# Patient Record
Sex: Female | Born: 1985 | Race: Black or African American | Hispanic: No | Smoking: Former smoker
Health system: Southern US, Community
[De-identification: ages and names within clinical notes are randomized; demographics above are authoritative.]

## PROBLEM LIST (undated history)

## (undated) DIAGNOSIS — J45909 Unspecified asthma, uncomplicated: Secondary | ICD-10-CM

## (undated) DIAGNOSIS — D649 Anemia, unspecified: Secondary | ICD-10-CM

## (undated) DIAGNOSIS — Z8742 Personal history of other diseases of the female genital tract: Secondary | ICD-10-CM

## (undated) DIAGNOSIS — Z8619 Personal history of other infectious and parasitic diseases: Secondary | ICD-10-CM

## (undated) HISTORY — PX: WISDOM TOOTH EXTRACTION: SHX21

## (undated) HISTORY — DX: Personal history of other diseases of the female genital tract: Z87.42

## (undated) HISTORY — DX: Personal history of other infectious and parasitic diseases: Z86.19

---
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
## 2008-11-09 ENCOUNTER — Emergency Department: Payer: Self-pay | Admitting: Emergency Medicine

## 2009-01-03 ENCOUNTER — Emergency Department: Payer: Self-pay | Admitting: Emergency Medicine

## 2009-02-04 ENCOUNTER — Emergency Department: Payer: Self-pay | Admitting: Emergency Medicine

## 2010-06-29 ENCOUNTER — Emergency Department (HOSPITAL_COMMUNITY)
Admission: EM | Admit: 2010-06-29 | Discharge: 2010-06-29 | Disposition: A | Discharge status: Home / Self Care | Payer: Self-pay | Attending: Emergency Medicine | Admitting: Emergency Medicine

## 2010-06-29 DIAGNOSIS — M79609 Pain in unspecified limb: Secondary | ICD-10-CM | POA: Insufficient documentation

## 2010-06-29 DIAGNOSIS — Y9229 Other specified public building as the place of occurrence of the external cause: Secondary | ICD-10-CM | POA: Insufficient documentation

## 2010-06-29 DIAGNOSIS — S0500XA Injury of conjunctiva and corneal abrasion without foreign body, unspecified eye, initial encounter: Secondary | ICD-10-CM | POA: Insufficient documentation

## 2010-06-29 DIAGNOSIS — H5710 Ocular pain, unspecified eye: Secondary | ICD-10-CM | POA: Insufficient documentation

## 2010-10-21 ENCOUNTER — Emergency Department: Payer: Self-pay | Admitting: Emergency Medicine

## 2011-04-08 ENCOUNTER — Emergency Department: Payer: Self-pay | Admitting: Unknown Physician Specialty

## 2012-08-09 ENCOUNTER — Emergency Department: Payer: Self-pay | Admitting: Emergency Medicine

## 2012-08-29 ENCOUNTER — Encounter: Payer: Self-pay | Admitting: Primary Care

## 2012-08-30 ENCOUNTER — Encounter: Payer: Self-pay | Admitting: Primary Care

## 2012-09-30 ENCOUNTER — Encounter: Payer: Self-pay | Admitting: Primary Care

## 2014-01-12 DIAGNOSIS — L209 Atopic dermatitis, unspecified: Secondary | ICD-10-CM | POA: Insufficient documentation

## 2014-01-18 ENCOUNTER — Emergency Department: Payer: Self-pay | Admitting: Emergency Medicine

## 2015-03-28 ENCOUNTER — Emergency Department
Admission: EM | Admit: 2015-03-28 | Discharge: 2015-03-28 | Disposition: A | Discharge status: Home / Self Care | Payer: Self-pay | Attending: Emergency Medicine | Admitting: Emergency Medicine

## 2015-03-28 ENCOUNTER — Encounter: Payer: Self-pay | Admitting: *Deleted

## 2015-03-28 DIAGNOSIS — Z8709 Personal history of other diseases of the respiratory system: Secondary | ICD-10-CM | POA: Insufficient documentation

## 2015-03-28 DIAGNOSIS — J111 Influenza due to unidentified influenza virus with other respiratory manifestations: Secondary | ICD-10-CM | POA: Insufficient documentation

## 2015-03-28 HISTORY — DX: Unspecified asthma, uncomplicated: J45.909

## 2015-03-28 MED ORDER — ALBUTEROL SULFATE HFA 108 (90 BASE) MCG/ACT IN AERS
1.0000 | INHALATION_SPRAY | Freq: Four times a day (QID) | RESPIRATORY_TRACT | Status: DC | PRN
Start: 2015-03-28 — End: 2019-03-08

## 2015-03-28 MED ORDER — HYDROCOD POLST-CPM POLST ER 10-8 MG/5ML PO SUER: 5.0000 mL | Freq: Two times a day (BID) | ORAL | Status: DC | PRN

## 2015-03-28 MED ORDER — OSELTAMIVIR PHOSPHATE 75 MG PO CAPS: 75.0000 mg | ORAL_CAPSULE | Freq: Two times a day (BID) | ORAL | Status: DC

## 2015-03-28 NOTE — Discharge Instructions (Signed)

## 2015-03-28 NOTE — ED Notes (Signed)
Pt states headache, fever, congestion, and body aches for 2 days, states productive cough

## 2015-03-28 NOTE — ED Notes (Signed)
Body aches with fever and cough for 2 days   States fever was 101 yesterday  Positive relief with OTC ibu

## 2015-03-28 NOTE — ED Provider Notes (Signed)
[facility] Emergency Department Provider Note  ____________________________________________  Time seen: Approximately 4:48 PM  I have reviewed the triage vital signs and the nursing notes.   HISTORY  Chief Complaint Fever; Chills; and Generalized Body Aches   HPI Wendy Robbins is a 30 y.o. female, NAD, presents to the emergency department with 2 day history sudden onset fevers, chills, body aches, cough. Fevers at home have ranged 99-102F. Has taken Motrin with some relief. Notes some nasal congestion, runny nose in which she has been taking DayQuil with mild relief. Notes her child has recently been diagnosed with hand-foot-and-mouth syndrome. She currently denies any sore throat or lesions in the mouth, hands, feet. No other known exposures.   Past Medical History  Diagnosis Date  . Asthma     There are no active problems to display for this patient.   History reviewed. No pertinent past surgical history.  Current Outpatient Rx  Name  Route  Sig  Dispense  Refill  . albuterol (PROVENTIL HFA;VENTOLIN HFA) 108 (90 Base) MCG/ACT inhaler   Inhalation   Inhale 1-2 puffs into the lungs every 6 (six) hours as needed for wheezing or shortness of breath.   1 Inhaler   2   . chlorpheniramine-HYDROcodone (TUSSIONEX PENNKINETIC ER) 10-8 MG/5ML SUER   Oral   Take 5 mLs by mouth every 12 (twelve) hours as needed for cough.   115 mL   0   . oseltamivir (TAMIFLU) 75 MG capsule   Oral   Take 1 capsule (75 mg total) by mouth 2 (two) times daily.   10 capsule   0     Allergies Shellfish allergy  History reviewed. No pertinent family history.  Social History Social History  Substance Use Topics  . Smoking status: Never Smoker   . Smokeless tobacco: None  . Alcohol Use: None     Review of Systems  Constitutional: Fevers, chills, fatigue. Eyes: No visual changes. No discharge, redness. ENT:  Nasal congestion, clear rhinorrhea. No sore  throat. No oral lesions. Cardiovascular: No chest pain Respiratory: Cough, wheezing. No shortness of breath. Gastrointestinal: No abdominal pain.  Nausea, no vomiting. Musculoskeletal: Generalized myalgias. Skin: Negative for rash nor lesions Neurological: Negative for headaches, focal weakness or numbness. 10-point ROS otherwise negative.  ____________________________________________   PHYSICAL EXAM:  VITAL SIGNS: ED Triage Vitals  Enc Vitals Group     BP 03/28/15 1640 124/76 mmHg     Pulse Rate 03/28/15 1640 108     Resp 03/28/15 1640 18     Temp 03/28/15 1640 99.7 F (37.6 C)     Temp Source 03/28/15 1640 Oral     SpO2 03/28/15 1640 99 %     Weight 03/28/15 1640 155 lb (70.308 kg)     Height 03/28/15 1640  (1.803 m)     Head Cir --      Peak Flow --      Pain Score 03/28/15 1641 10     Pain Loc --      Pain Edu? --      Excl. in GC? --     Constitutional: Alert and oriented. Well appearing and in no acute distress. Eyes: Conjunctivae are normal. PERRL. EOMI without pain. Head: Atraumatic. ENT:      Ears: TMs visualized bilaterally without perforation. Trace serous fluid with mild injection. Bilateral ear canals without abnormality      Nose: Clear rhinorrhea      Mouth/Throat: Mucous membranes are moist without  any oral lesions Neck: Supple with full range of motion. No stridor Hematological/Lymphatic/Immunilogical: No cervical lymphadenopathy. Cardiovascular: Normal rate, regular rhythm. Normal S1 and S2.  Good peripheral circulation. Respiratory: Normal respiratory effort without tachypnea or retractions. Lungs CTAB. Neurologic:  Normal speech and language. No gross focal neurologic deficits are appreciated.  Skin:  Skin is warm, dry and intact. No rash noted. Psychiatric: Mood and affect are normal. Speech and behavior are normal. Patient exhibits appropriate insight and judgement.   ____________________________________________   LABS (all labs ordered  are listed, but only abnormal results are displayed)  Labs Reviewed - No data to display ____________________________________________  EKG  None ____________________________________________  RADIOLOGY  None ____________________________________________    PROCEDURES  Procedure(s) performed: None   Medications - No data to display   ____________________________________________   INITIAL IMPRESSION / ASSESSMENT AND PLAN / ED COURSE  Pertinent labs & imaging results that were available during my care of the patient were reviewed by me and considered in my medical decision making (see chart for details).  Patient's diagnosis is consistent with influenza. Patient will be discharged home with prescriptions for Tamiflu, albuterol inhaler, dictation next cough syrup. [facility] this treatment course. Patient is given ED precautions to return to the ED for any worsening or new symptoms.   ____________________________________________  FINAL CLINICAL IMPRESSION(S) / ED DIAGNOSES  Final diagnoses:  Influenza  History of asthma      NEW MEDICATIONS STARTED DURING THIS VISIT:  New Prescriptions   ALBUTEROL (PROVENTIL HFA;VENTOLIN HFA) 108 (90 BASE) MCG/ACT INHALER    Inhale 1-2 puffs into the lungs every 6 (six) hours as needed for wheezing or shortness of breath.   CHLORPHENIRAMINE-HYDROCODONE (TUSSIONEX PENNKINETIC ER) 10-8 MG/5ML SUER    Take 5 mLs by mouth every 12 (twelve) hours as needed for cough.   OSELTAMIVIR (TAMIFLU) 75 MG CAPSULE    Take 1 capsule (75 mg total) by mouth 2 (two) times daily.        Hope Pigeon, PA-C 03/28/15 1714  Rockne Menghini, MD 04/03/15 2248

## 2015-07-19 DIAGNOSIS — Z8742 Personal history of other diseases of the female genital tract: Secondary | ICD-10-CM | POA: Insufficient documentation

## 2016-07-22 ENCOUNTER — Encounter: Payer: Self-pay | Admitting: Medical Oncology

## 2016-07-22 ENCOUNTER — Emergency Department
Admission: EM | Admit: 2016-07-22 | Discharge: 2016-07-22 | Disposition: A | Discharge status: Home / Self Care | Payer: Self-pay | Attending: Emergency Medicine | Admitting: Emergency Medicine

## 2016-07-22 DIAGNOSIS — Y929 Unspecified place or not applicable: Secondary | ICD-10-CM | POA: Insufficient documentation

## 2016-07-22 DIAGNOSIS — Y9389 Activity, other specified: Secondary | ICD-10-CM | POA: Insufficient documentation

## 2016-07-22 DIAGNOSIS — Y990 Civilian activity done for income or pay: Secondary | ICD-10-CM | POA: Insufficient documentation

## 2016-07-22 DIAGNOSIS — J45909 Unspecified asthma, uncomplicated: Secondary | ICD-10-CM | POA: Insufficient documentation

## 2016-07-22 DIAGNOSIS — S39012A Strain of muscle, fascia and tendon of lower back, initial encounter: Secondary | ICD-10-CM | POA: Insufficient documentation

## 2016-07-22 DIAGNOSIS — X500XXA Overexertion from strenuous movement or load, initial encounter: Secondary | ICD-10-CM | POA: Insufficient documentation

## 2016-07-22 DIAGNOSIS — Z79899 Other long term (current) drug therapy: Secondary | ICD-10-CM | POA: Insufficient documentation

## 2016-07-22 MED ORDER — KETOROLAC TROMETHAMINE 30 MG/ML IJ SOLN
30.0000 mg | Freq: Once | INTRAMUSCULAR | Status: AC
  Administered 2016-07-22: 30 mg via INTRAMUSCULAR
  Filled 2016-07-22: qty 1

## 2016-07-22 MED ORDER — MELOXICAM 15 MG PO TABS
15.0000 mg | ORAL_TABLET | Freq: Every day | ORAL | 0 refills | Status: AC
Start: 2016-07-22 — End: 2017-07-22

## 2016-07-22 NOTE — ED Notes (Signed)
Lower back pain, worse on right. 3/10. No numbness BLE. Denies burning or pain with urination.

## 2016-07-22 NOTE — ED Triage Notes (Signed)
Pt reports lots of heavy lifting at work, since Monday has been having lower back pain.

## 2016-07-22 NOTE — ED Provider Notes (Signed)
[facility] Emergency Department Provider Note ____________________________________________  Time seen: 9:18 AM  I have reviewed the triage vital signs and the nursing notes.  HISTORY  Chief Complaint  Back Pain   HPI Wendy Robbins is a 31 y.o. female is here with complaint of lower back pain after lifting at work for the last 2 days. Patient states that she took ibuprofen on Monday has not taken any since. She denies any urinary symptoms. There is been no incontinence of bowel or bladder. She states that today she is more stiff than yesterday. She continues to ambulate without assistance. Currently she is on her menses and denies any chance of pregnancy. She rates her pain as an 8 out of 10.  Past Medical History:  Diagnosis Date  . Asthma     There are no active problems to display for this patient.   No past surgical history on file.  Prior to Admission medications   Medication Sig Start Date End Date Taking? Authorizing Provider  albuterol (PROVENTIL HFA;VENTOLIN HFA) 108 (90 Base) MCG/ACT inhaler Inhale 1-2 puffs into the lungs every 6 (six) hours as needed for wheezing or shortness of breath. 03/28/15   Hagler, Jami L, PA-C  chlorpheniramine-HYDROcodone (TUSSIONEX PENNKINETIC ER) 10-8 MG/5ML SUER Take 5 mLs by mouth every 12 (twelve) hours as needed for cough. 03/28/15   Hagler, Jami L, PA-C  meloxicam (MOBIC) 15 MG tablet Take 1 tablet (15 mg total) by mouth daily. 07/22/16 07/22/17  Tommi Rumps, PA-C  oseltamivir (TAMIFLU) 75 MG capsule Take 1 capsule (75 mg total) by mouth 2 (two) times daily. 03/28/15   Hagler, Jami L, PA-C    Allergies Shellfish allergy  No family history on file.  Social History Social History  Substance Use Topics  . Smoking status: Never Smoker  . Smokeless tobacco: Not on file  . Alcohol use Not on file    Review of Systems  Constitutional: Negative for fever. Cardiovascular: Negative for chest  pain. Respiratory: Negative for shortness of breath. Gastrointestinal: Negative for abdominal pain Genitourinary: Negative for dysuria. Musculoskeletal: Positive for low back pain. Skin: Negative for rash. Neurological: Negative for headaches, focal weakness or numbness. ____________________________________________  PHYSICAL EXAM:  VITAL SIGNS: ED Triage Vitals  Enc Vitals Group     BP 07/22/16 0906 129/71     Pulse Rate 07/22/16 0906 77     Resp 07/22/16 0906 16     Temp 07/22/16 0906 98 F (36.7 C)     Temp Source 07/22/16 0906 Oral     SpO2 07/22/16 0906 100 %     Weight 07/22/16 0905 140 lb (63.5 kg)     Height 07/22/16 0905 5\' 10"  (1.778 m)     Head Circumference --      Peak Flow --      Pain Score 07/22/16 0903 8     Pain Loc --      Pain Edu? --      Excl. in GC? --     Constitutional: Alert and oriented. Well appearing and in no distress. Head: Normocephalic and atraumatic. Eyes: Conjunctivae are normal.  Cardiovascular: Normal rate, regular rhythm. Normal distal pulses. Respiratory: Normal respiratory effort. No wheezes/rales/rhonchi. Musculoskeletal: Examination of lower back there is no gross deformity. There is no midline tenderness on palpation of the lumbar spine. Paravertebral muscles are tender with the right being greater than the left. Range of motion is slow without active muscle spasm seen. Patient is ambulatory without assistance.  Neurologic:  Normal gait without ataxia. Normal speech and language. No gross focal neurologic deficits are appreciated. Skin:  Skin is warm, dry and intact. No rash noted. Psychiatric: Mood and affect are normal. Patient exhibits appropriate insight and judgment. ____________________________________________  INITIAL IMPRESSION / ASSESSMENT AND PLAN / ED COURSE  Patient was given Toradol 30 mg IM in the department and did get some relief from her back pain. She is discharged with a prescription for meloxicam 15 mg one daily  with food. [facility] if any continued problems.    ____________________________________________  FINAL CLINICAL IMPRESSION(S) / ED DIAGNOSES  Final diagnoses:  Strain of lumbar region, initial encounter     Eather ColasSummers, Rhonda L, PA-C 07/22/16 1052    Sharman CheekStafford, Phillip, MD 07/23/16 [phone]

## 2016-07-22 NOTE — Discharge Instructions (Signed)
[facility]. Begin taking meloxicam 15 mg one daily with food.

## 2018-03-05 HISTORY — PX: OTHER SURGICAL HISTORY: SHX169

## 2018-03-22 LAB — HM PAP SMEAR: HM Pap smear: NEGATIVE

## 2018-03-22 LAB — HM HIV SCREENING LAB: HM HIV Screening: NEGATIVE

## 2018-08-16 LAB — OB RESULTS CONSOLE HIV ANTIBODY (ROUTINE TESTING): HIV: NONREACTIVE

## 2018-08-16 LAB — OB RESULTS CONSOLE HEPATITIS B SURFACE ANTIGEN: Hepatitis B Surface Ag: NEGATIVE

## 2018-08-16 LAB — OB RESULTS CONSOLE VARICELLA ZOSTER ANTIBODY, IGG: Varicella: IMMUNE

## 2018-08-16 LAB — OB RESULTS CONSOLE RPR: RPR: NONREACTIVE

## 2018-08-16 LAB — OB RESULTS CONSOLE RUBELLA ANTIBODY, IGM: Rubella: IMMUNE

## 2018-08-23 ENCOUNTER — Encounter: Payer: Self-pay | Admitting: Advanced Practice Midwife

## 2019-02-22 ENCOUNTER — Observation Stay
Admission: EM | Admit: 2019-02-22 | Discharge: 2019-02-22 | Disposition: A | Discharge status: Home / Self Care | Payer: Medicaid Other | Attending: Obstetrics and Gynecology | Admitting: Obstetrics and Gynecology

## 2019-02-22 ENCOUNTER — Encounter: Payer: Self-pay | Admitting: *Deleted

## 2019-02-22 ENCOUNTER — Other Ambulatory Visit: Payer: Self-pay

## 2019-02-22 DIAGNOSIS — O99513 Diseases of the respiratory system complicating pregnancy, third trimester: Secondary | ICD-10-CM | POA: Insufficient documentation

## 2019-02-22 DIAGNOSIS — Z3A36 36 weeks gestation of pregnancy: Secondary | ICD-10-CM | POA: Diagnosis not present

## 2019-02-22 DIAGNOSIS — O133 Gestational [pregnancy-induced] hypertension without significant proteinuria, third trimester: Secondary | ICD-10-CM | POA: Diagnosis present

## 2019-02-22 DIAGNOSIS — J45909 Unspecified asthma, uncomplicated: Secondary | ICD-10-CM | POA: Insufficient documentation

## 2019-02-22 DIAGNOSIS — Z91013 Allergy to seafood: Secondary | ICD-10-CM | POA: Insufficient documentation

## 2019-02-22 DIAGNOSIS — Z79899 Other long term (current) drug therapy: Secondary | ICD-10-CM | POA: Insufficient documentation

## 2019-02-22 DIAGNOSIS — O163 Unspecified maternal hypertension, third trimester: Secondary | ICD-10-CM | POA: Diagnosis not present

## 2019-02-22 HISTORY — DX: Anemia, unspecified: D64.9

## 2019-02-22 LAB — CBC
HCT: 33.4 % — ABNORMAL LOW (ref 36.0–46.0)
Hemoglobin: 11.2 g/dL — ABNORMAL LOW (ref 12.0–15.0)
MCH: 31.1 pg (ref 26.0–34.0)
MCHC: 33.5 g/dL (ref 30.0–36.0)
MCV: 92.8 fL (ref 80.0–100.0)
Platelets: 203 10*3/uL (ref 150–400)
RBC: 3.6 MIL/uL — ABNORMAL LOW (ref 3.87–5.11)
RDW: 13.6 % (ref 11.5–15.5)
WBC: 10 10*3/uL (ref 4.0–10.5)
nRBC: 0 % (ref 0.0–0.2)

## 2019-02-22 LAB — COMPREHENSIVE METABOLIC PANEL
ALT: 12 U/L (ref 0–44)
AST: 19 U/L (ref 15–41)
Albumin: 3.1 g/dL — ABNORMAL LOW (ref 3.5–5.0)
Alkaline Phosphatase: 137 U/L — ABNORMAL HIGH (ref 38–126)
Anion gap: 9 (ref 5–15)
BUN: 10 mg/dL (ref 6–20)
CO2: 18 mmol/L — ABNORMAL LOW (ref 22–32)
Calcium: 8.8 mg/dL — ABNORMAL LOW (ref 8.9–10.3)
Chloride: 108 mmol/L (ref 98–111)
Creatinine, Ser: 0.8 mg/dL (ref 0.44–1.00)
GFR calc Af Amer: >60 mL/min (ref >60)
GFR calc non Af Amer: >60 mL/min (ref >60)
Glucose, Bld: 77 mg/dL (ref 70–99)
Potassium: 3.8 mmol/L (ref 3.5–5.1)
Sodium: 135 mmol/L (ref 135–145)
Total Bilirubin: 0.6 mg/dL (ref 0.3–1.2)
Total Protein: 6.4 g/dL — ABNORMAL LOW (ref 6.5–8.1)

## 2019-02-22 LAB — PROTEIN / CREATININE RATIO, URINE
Creatinine, Urine: 72 mg/dL
Protein Creatinine Ratio: 0.19 mg/mg{Cre} — ABNORMAL HIGH (ref 0.00–0.15)
Total Protein, Urine: 14 mg/dL

## 2019-02-22 LAB — OB RESULTS CONSOLE GC/CHLAMYDIA
Chlamydia: NEGATIVE
Gonorrhea: NEGATIVE

## 2019-02-22 LAB — OB RESULTS CONSOLE GBS: GBS: NEGATIVE

## 2019-02-22 NOTE — OB Triage Note (Signed)
Sent over from the office for [facility] evaluation. Wendy Robbins

## 2019-02-22 NOTE — Discharge Instructions (Signed)
Preeclampsia and Eclampsia Preeclampsia is a serious condition that may develop during pregnancy. This condition causes high blood pressure and increased protein in your urine along with other symptoms, such as headaches and vision changes. These symptoms may develop as the condition gets worse. Preeclampsia may occur at 20 weeks of pregnancy or later. Diagnosing and treating preeclampsia early is very important. If not treated early, it can cause serious problems for you and your baby. One problem it can lead to is eclampsia. Eclampsia is a condition that causes muscle jerking or shaking (convulsions or seizures) and other serious problems for the mother. During pregnancy, delivering your baby may be the best treatment for preeclampsia or eclampsia. For most women, preeclampsia and eclampsia symptoms go away after giving birth. In rare cases, a woman may develop preeclampsia after giving birth (postpartum preeclampsia). This usually occurs within 48 hours after childbirth but may occur up to 6 weeks after giving birth. What are the causes? The cause of preeclampsia is not known. What increases the risk? The following risk factors make you more likely to develop preeclampsia:  Being pregnant for the first time.  Having had preeclampsia during a past pregnancy.  Having a family history of preeclampsia.  Having high blood pressure.  Being pregnant with more than one baby.  Being 33 or older.  Being African-American.  Having kidney disease or diabetes.  Having medical conditions such as lupus or blood diseases.  Being very overweight (obese). What are the signs or symptoms? The most common symptoms are:  Severe headaches.  Vision problems, such as blurred or double vision.  Abdominal pain, especially upper abdominal pain. Other symptoms that may develop as the condition gets worse include:  Sudden weight gain.  Sudden swelling of the hands, face, legs, and feet.  Severe  nausea and vomiting.  Numbness in the face, arms, legs, and feet.  Dizziness.  Urinating less than usual.  Slurred speech.  Convulsions or seizures. How is this diagnosed? There are no screening tests for preeclampsia. Your health care provider will ask you about symptoms and check for signs of preeclampsia during your prenatal visits. You may also have tests that include:  Checking your blood pressure.  Urine tests to check for protein. Your health care provider will check for this at every prenatal visit.  Blood tests.  Monitoring your baby's heart rate.  Ultrasound. How is this treated? You and your health care provider will determine the treatment approach that is best for you. Treatment may include:  Having more frequent prenatal exams to check for signs of preeclampsia, if you have an increased risk for preeclampsia.  Medicine to lower your blood pressure.  Staying in the hospital, if your condition is severe. There, treatment will focus on controlling your blood pressure and the amount of fluids in your body (fluid retention).  Taking medicine (magnesium sulfate) to prevent seizures. This may be given as an injection or through an IV.  Taking a low-dose aspirin during your pregnancy.  Delivering your baby early. You may have your labor started with medicine (induced), or you may have a cesarean delivery. Follow these instructions at home: Eating and drinking   Drink enough fluid to keep your urine pale yellow.  Avoid caffeine. Lifestyle  Do not use any products that contain nicotine or tobacco, such as cigarettes and e-cigarettes. If you need help quitting, ask your health care provider.  Do not use alcohol or drugs.  Avoid stress as much as possible. Rest and get  plenty of sleep. General instructions  Take over-the-counter and prescription medicines only as told by your health care provider.  When lying down, lie on your left side. This keeps pressure  off your major blood vessels.  When sitting or lying down, raise (elevate) your feet. Try putting some pillows underneath your lower legs.  Exercise regularly. Ask your health care provider what kinds of exercise are best for you.  Keep all follow-up and prenatal visits as told by your health care provider. This is important. How is this prevented? There is no known way of preventing preeclampsia or eclampsia from developing. However, to lower your risk of complications and detect problems early:  Get regular prenatal care. Your health care provider may be able to diagnose and treat the condition early.  Maintain a healthy weight. Ask your health care provider for help managing weight gain during pregnancy.  Work with your health care provider to manage any long-term (chronic) health conditions you have, such as diabetes or kidney problems.  You may have tests of your blood pressure and kidney function after giving birth.  Your health care provider may have you take low-dose aspirin during your next pregnancy. Contact a health care provider if:  You have symptoms that your health care provider told you may require more treatment or monitoring, such as: ? Headaches. ? Nausea or vomiting. ? Abdominal pain. ? Dizziness. ? Light-headedness. Get help right away if:  You have severe: ? Abdominal pain. ? Headaches that do not get better. ? Dizziness. ? Vision problems. ? Confusion. ? Nausea or vomiting.  You have any of the following: ? A seizure. ? Sudden, rapid weight gain. ? Sudden swelling in your hands, ankles, or face. ? Trouble moving any part of your body. ? Numbness in any part of your body. ? Trouble speaking. ? Abnormal bleeding.  You faint. Summary  Preeclampsia is a serious condition that may develop during pregnancy.  This condition causes high blood pressure and increased protein in your urine along with other symptoms, such as headaches and vision  changes.  Diagnosing and treating preeclampsia early is very important. If not treated early, it can cause serious problems for you and your baby.  Get help right away if you have symptoms that your health care provider told you to watch for. This information is not intended to replace advice given to you by your health care provider. Make sure you discuss any questions you have with your health care provider. Document Released: 02/14/2000 Document Revised: 10/19/2017 Document Reviewed: 09/23/2015 Elsevier Patient Education  2020 [place] your blood pressure at home twice a day. Please write down the readings so we can discuss it at your appointment on Monday.  Call provider right away for blood pressure systolic 836 or greater OR diastolic 629 or greater.  Call provider right away for any of the warning signs listed above.

## 2019-02-22 NOTE — Discharge Summary (Signed)
Wendy Robbins is a 33 y.o. female. She is at [redacted]w[redacted]d gestation. No LMP recorded. Patient is pregnant. Estimated Date of Delivery: 03/16/19  Prenatal care site: [facility]  Chief complaint: Sent from office d/t elevated blood pressure fore preeclampsia eval  Patient was evaluated in the office today for a routine OB appointment.  Her initial b/p was 154/76, recheck was 150/98.  She denies HA, changes in vision, RUQ.  Reports mild dependent edema that goes away after resting.   S: Resting comfortably.   She reports:  -active fetal movement -no leakage of fluid -no vaginal bleeding -no contractions  Maternal Medical History:   Past Medical History:  Diagnosis Date  . Anemia   . Asthma     Past Surgical History:  Procedure Laterality Date  . WISDOM TOOTH EXTRACTION      Allergies  Allergen Reactions  . Shellfish-Derived Products Anaphylaxis  . Shellfish Allergy     Prior to Admission medications   Medication Sig Start Date End Date Taking? Authorizing Provider  ferrous sulfate 325 (65 FE) MG EC tablet Take 325 mg by mouth 3 (three) times daily with meals.   Yes [provider]  Prenatal Vit-Fe Fumarate-FA (MULTIVITAMIN-PRENATAL) 27-0.8 MG TABS tablet Take 1 tablet by mouth daily at 12 noon.   Yes [provider]  albuterol (PROVENTIL HFA;VENTOLIN HFA) 108 (90 Base) MCG/ACT inhaler Inhale 1-2 puffs into the lungs every 6 (six) hours as needed for wheezing or shortness of breath. Patient not taking: Reported on 02/22/2019 03/28/15   Hagler, Jami L, PA-C  chlorpheniramine-HYDROcodone (TUSSIONEX PENNKINETIC ER) 10-8 MG/5ML SUER Take 5 mLs by mouth every 12 (twelve) hours as needed for cough. Patient not taking: Reported on 02/22/2019 03/28/15   Hagler, Jami L, PA-C  oseltamivir (TAMIFLU) 75 MG capsule Take 1 capsule (75 mg total) by mouth 2 (two) times daily. Patient not taking: Reported on 02/22/2019 03/28/15   Hagler, Ernestene Kiel, PA-C     Social  History: She  reports that she has never smoked. She does not have any smokeless tobacco history on file. She reports previous alcohol use. She reports previous drug use.  Family History: family history is not on file.  no history of gyn cancers  Review of Systems: A full review of systems was performed and negative except as noted in the HPI.    O:  BP 134/83   Pulse 80   Temp 98.1 F (36.7 C) (Oral)   Resp 18   Ht 5\' 11"  (1.803 m)   Wt 81.6 kg   BMI 25.10 kg/m  Results for orders placed or performed during the hospital encounter of 02/22/19 (from the past 48 hour(s))  Comprehensive metabolic panel   Collection Time: 02/22/19 11:01 AM  Result Value Ref Range   Sodium 135 135 - 145 mmol/L   Potassium 3.8 3.5 - 5.1 mmol/L   Chloride 108 98 - 111 mmol/L   CO2 18 (L) 22 - 32 mmol/L   Glucose, Bld 77 70 - 99 mg/dL   BUN 10 6 - 20 mg/dL   Creatinine, Ser 02/24/19 0.44 - 1.00 mg/dL   Calcium 8.8 (L) 8.9 - 10.3 mg/dL   Total Protein 6.4 (L) 6.5 - 8.1 g/dL   Albumin 3.1 (L) 3.5 - 5.0 g/dL   AST 19 15 - 41 U/L   ALT 12 0 - 44 U/L   Alkaline Phosphatase 137 (H) 38 - 126 U/L   Total Bilirubin 0.6 0.3 - 1.2 mg/dL  GFR calc non Af Amer >60 >60 mL/min   GFR calc Af Amer >60 >60 mL/min   Anion gap 9 5 - 15  CBC on admission   Collection Time: 02/22/19 11:01 AM  Result Value Ref Range   WBC 10.0 4.0 - 10.5 K/uL   RBC 3.60 (L) 3.87 - 5.11 MIL/uL   Hemoglobin 11.2 (L) 12.0 - 15.0 g/dL   HCT 33.4 (L) 36.0 - 46.0 %   MCV 92.8 80.0 - 100.0 fL   MCH 31.1 26.0 - 34.0 pg   MCHC 33.5 30.0 - 36.0 g/dL   RDW 13.6 11.5 - 15.5 %   Platelets 203 150 - 400 K/uL   nRBC 0.0 0.0 - 0.2 %  Protein / creatinine ratio, urine   Collection Time: 02/22/19 11:05 AM  Result Value Ref Range   Creatinine, Urine 72 mg/dL   Total Protein, Urine 14 mg/dL   Protein Creatinine Ratio 0.19 (H) 0.00 - 0.15 mg/mg[Cre]     Constitutional: NAD, AAOx3  HE/ENT: extraocular movements grossly intact, moist mucous  membranes CV: RRR PULM: normal respiratory effort, CTABL     Abd: gravid, non-tender, non-distended, soft      Ext: Non-tender, mild, dependent edema in LE   Psych: mood appropriate, speech normal Pelvic deferred  NST:  Baseline: 135 Variability: moderate Accelerations:15x15 present  Decelerations: absent Toco: Irregular, palpate mild Category 1 Reactive NST   A/P: 33 y.o. [redacted]w[redacted]d here for antenatal surveillance during pregnancy.  Principle diagnosis:   Elevated blood pressure affecting pregnancy  Blood pressure ranges from 130-139/83-90.    Labs WNL  No incidence of elevated blood pressure 4 hours apart   Call for headache, changes in vision, or RUQ.   Labor  Not present  Fetal Wellbeing  Reactive NST, reassuring for GA  D/c home stable, precautions reviewed, follow-up as scheduled on Monday.  Patient to check blood pressure at home twice a day  Reviewed blood pressure parameters  Call provider right away for 160/110 or greater    Minda Meo, CNM 02/22/2019 12:40 PM [facility], Department of [facility]

## 2019-03-01 ENCOUNTER — Encounter
Admission: RE | Admit: 2019-03-01 | Discharge: 2019-03-01 | Disposition: A | Discharge status: Home / Self Care | Payer: Medicaid Other | Source: Ambulatory Visit | Attending: Obstetrics and Gynecology | Admitting: Obstetrics and Gynecology

## 2019-03-01 ENCOUNTER — Other Ambulatory Visit: Payer: Self-pay

## 2019-03-01 NOTE — Patient Instructions (Signed)
Your COVID test is scheduled any time between 8:00 am and noon on Thursday 03/02/2019.  Drive up in front of the [facility] and remain in your vehicle.  Your procedure is scheduled on: Monday 03/06/2019.  Check in on the 1st floor [facility] Main Entrance) at 9:30 am   Remember: Instructions that are not followed completely may result in serious medical risk, up to and including death, or upon the discretion of your surgeon and anesthesiologist your surgery may need to be rescheduled.    __x__ 1. Do not eat food (including mints, candies, chewing gum) after midnight the night before your procedure. You may drink clear liquids up to 2 hours before you are scheduled to arrive at the hospital for your procedure.  Do not drink anything within 2 hours of your scheduled arrival to the hospital.  Approved clear liquids:  --Water or Apple juice without pulp  --Clear carbohydrate beverage such as Gatorade or Powerade  --Black Coffee or Clear Tea (No milk, no creamers, do not add anything to the coffee or tea)    __x__ 2. No Alcohol or smoking for 24 hours before or after surgery.   __x__ 3. Notify your doctor if there is any change in your medical condition (cold, fever, infections).   __x__ 4. On the morning of surgery brush your teeth with toothpaste and water.  You may rinse your mouth with mouthwash if you wish.  Do not swallow any toothpaste or mouthwash.  Please read over the following fact sheets that you were given:   [facility] Preparing for Surgery and/or MRSA Information    __x__ Use CHG Soap as directed on instruction sheet.   Do not wear jewelry, make-up, hairpins, clips or nail polish on the day of surgery.  Do not wear lotions, powders, deodorant, or perfumes.   Do not shave below the face/neck 48 hours prior to surgery.   Do not bring valuables to the hospital.    [facility] is not responsible for any belongings or valuables.               For patients admitted  to the hospital, discharge time is determined by your treatment team.   __x__ Take anti-hypertensive listed below, cardiac, seizure, asthma, anti-reflux and psychiatric medicines. These include:  1. NONE  __x__ STARTING TODAY: Do not take any Anti-inflammatories such as Advil, Ibuprofen, Motrin, Aleve, Naproxen, Naprosyn, BC/Goodies powders or aspirin products. You CAN take Tylenol if needed.   __x__ STARTING TODAY: Do not take any over the counter supplements. You CAN continue to take your prenatal vitamin and Iron supplement.

## 2019-03-02 ENCOUNTER — Other Ambulatory Visit
Admission: RE | Admit: 2019-03-02 | Discharge: 2019-03-02 | Disposition: A | Discharge status: Home / Self Care | Payer: Medicaid Other | Source: Ambulatory Visit | Attending: Obstetrics and Gynecology | Admitting: Obstetrics and Gynecology

## 2019-03-02 DIAGNOSIS — Z20828 Contact with and (suspected) exposure to other viral communicable diseases: Secondary | ICD-10-CM | POA: Insufficient documentation

## 2019-03-02 DIAGNOSIS — Z01812 Encounter for preprocedural laboratory examination: Secondary | ICD-10-CM | POA: Insufficient documentation

## 2019-03-02 LAB — TYPE AND SCREEN
ABO/RH(D): B POS
Antibody Screen: NEGATIVE
Extend sample reason: UNDETERMINED

## 2019-03-02 LAB — RAPID HIV SCREEN (HIV 1/2 AB+AG)
HIV 1/2 Antibodies: NONREACTIVE
HIV-1 P24 Antigen - HIV24: NONREACTIVE

## 2019-03-02 LAB — CBC
HCT: 38.9 % (ref 36.0–46.0)
Hemoglobin: 12.8 g/dL (ref 12.0–15.0)
MCH: 30.8 pg (ref 26.0–34.0)
MCHC: 32.9 g/dL (ref 30.0–36.0)
MCV: 93.7 fL (ref 80.0–100.0)
Platelets: 243 10*3/uL (ref 150–400)
RBC: 4.15 MIL/uL (ref 3.87–5.11)
RDW: 14.6 % (ref 11.5–15.5)
WBC: 11.2 10*3/uL — ABNORMAL HIGH (ref 4.0–10.5)
nRBC: 0 % (ref 0.0–0.2)

## 2019-03-02 LAB — BASIC METABOLIC PANEL
Anion gap: 9 (ref 5–15)
BUN: 10 mg/dL (ref 6–20)
CO2: 19 mmol/L — ABNORMAL LOW (ref 22–32)
Calcium: 8.6 mg/dL — ABNORMAL LOW (ref 8.9–10.3)
Chloride: 109 mmol/L (ref 98–111)
Creatinine, Ser: 0.8 mg/dL (ref 0.44–1.00)
GFR calc Af Amer: >60 mL/min (ref >60)
GFR calc non Af Amer: >60 mL/min (ref >60)
Glucose, Bld: 74 mg/dL (ref 70–99)
Potassium: 4 mmol/L (ref 3.5–5.1)
Sodium: 137 mmol/L (ref 135–145)

## 2019-03-02 NOTE — H&P (Signed)
Please see H&P signed on 03/06/2019

## 2019-03-03 LAB — RPR: RPR Ser Ql: NONREACTIVE

## 2019-03-03 LAB — SARS CORONAVIRUS 2 (TAT 6-24 HRS): SARS Coronavirus 2: NEGATIVE

## 2019-03-06 ENCOUNTER — Inpatient Hospital Stay
Admission: RE | Admit: 2019-03-06 | Discharge: 2019-03-08 | DRG: 787 | Disposition: A | Discharge status: Home / Self Care | Payer: Medicaid Other | Attending: Obstetrics and Gynecology | Admitting: Obstetrics and Gynecology

## 2019-03-06 ENCOUNTER — Inpatient Hospital Stay
Admission: RE | Admit: 2019-03-06 | Payer: Medicaid Other | Source: Home / Self Care | Admitting: Obstetrics and Gynecology

## 2019-03-06 ENCOUNTER — Other Ambulatory Visit: Payer: Self-pay

## 2019-03-06 ENCOUNTER — Encounter
Admission: RE | Disposition: A | Discharge status: Home / Self Care | Payer: Self-pay | Source: Home / Self Care | Attending: Obstetrics and Gynecology

## 2019-03-06 ENCOUNTER — Inpatient Hospital Stay: Payer: Medicaid Other | Admitting: Anesthesiology

## 2019-03-06 ENCOUNTER — Encounter: Payer: Self-pay | Admitting: Obstetrics and Gynecology

## 2019-03-06 DIAGNOSIS — O9081 Anemia of the puerperium: Secondary | ICD-10-CM | POA: Diagnosis not present

## 2019-03-06 DIAGNOSIS — D62 Acute posthemorrhagic anemia: Secondary | ICD-10-CM | POA: Diagnosis not present

## 2019-03-06 DIAGNOSIS — O134 Gestational [pregnancy-induced] hypertension without significant proteinuria, complicating childbirth: Secondary | ICD-10-CM | POA: Diagnosis present

## 2019-03-06 DIAGNOSIS — O321XX0 Maternal care for breech presentation, not applicable or unspecified: Principal | ICD-10-CM | POA: Diagnosis present

## 2019-03-06 DIAGNOSIS — O139 Gestational [pregnancy-induced] hypertension without significant proteinuria, unspecified trimester: Secondary | ICD-10-CM | POA: Diagnosis present

## 2019-03-06 DIAGNOSIS — O900 Disruption of cesarean delivery wound: Secondary | ICD-10-CM | POA: Diagnosis present

## 2019-03-06 DIAGNOSIS — Z23 Encounter for immunization: Secondary | ICD-10-CM

## 2019-03-06 DIAGNOSIS — Z3A38 38 weeks gestation of pregnancy: Secondary | ICD-10-CM

## 2019-03-06 LAB — COMPREHENSIVE METABOLIC PANEL
ALT: 16 U/L (ref 0–44)
AST: 27 U/L (ref 15–41)
Albumin: 2.9 g/dL — ABNORMAL LOW (ref 3.5–5.0)
Alkaline Phosphatase: 139 U/L — ABNORMAL HIGH (ref 38–126)
Anion gap: 6 (ref 5–15)
BUN: 11 mg/dL (ref 6–20)
CO2: 20 mmol/L — ABNORMAL LOW (ref 22–32)
Calcium: 8 mg/dL — ABNORMAL LOW (ref 8.9–10.3)
Chloride: 107 mmol/L (ref 98–111)
Creatinine, Ser: 0.73 mg/dL (ref 0.44–1.00)
GFR calc Af Amer: >60 mL/min (ref >60)
GFR calc non Af Amer: >60 mL/min (ref >60)
Glucose, Bld: 108 mg/dL — ABNORMAL HIGH (ref 70–99)
Potassium: 4 mmol/L (ref 3.5–5.1)
Sodium: 133 mmol/L — ABNORMAL LOW (ref 135–145)
Total Bilirubin: 0.9 mg/dL (ref 0.3–1.2)
Total Protein: 5.7 g/dL — ABNORMAL LOW (ref 6.5–8.1)

## 2019-03-06 LAB — CBC WITH DIFFERENTIAL/PLATELET
Abs Immature Granulocytes: 0.17 10*3/uL — ABNORMAL HIGH (ref 0.00–0.07)
Basophils Absolute: 0 10*3/uL (ref 0.0–0.1)
Basophils Relative: 0 %
Eosinophils Absolute: 0 10*3/uL (ref 0.0–0.5)
Eosinophils Relative: 0 %
HCT: 32.9 % — ABNORMAL LOW (ref 36.0–46.0)
Hemoglobin: 10.8 g/dL — ABNORMAL LOW (ref 12.0–15.0)
Immature Granulocytes: 1 %
Lymphocytes Relative: 5 %
Lymphs Abs: 0.9 10*3/uL (ref 0.7–4.0)
MCH: 31.3 pg (ref 26.0–34.0)
MCHC: 32.8 g/dL (ref 30.0–36.0)
MCV: 95.4 fL (ref 80.0–100.0)
Monocytes Absolute: 0.2 10*3/uL (ref 0.1–1.0)
Monocytes Relative: 1 %
Neutro Abs: 16 10*3/uL — ABNORMAL HIGH (ref 1.7–7.7)
Neutrophils Relative %: 93 %
Platelets: 225 10*3/uL (ref 150–400)
RBC: 3.45 MIL/uL — ABNORMAL LOW (ref 3.87–5.11)
RDW: 14.7 % (ref 11.5–15.5)
WBC: 17.4 10*3/uL — ABNORMAL HIGH (ref 4.0–10.5)
nRBC: 0 % (ref 0.0–0.2)

## 2019-03-06 LAB — PROTEIN / CREATININE RATIO, URINE
Creatinine, Urine: 76 mg/dL
Protein Creatinine Ratio: 0.33 mg/mg{Cre} — ABNORMAL HIGH (ref 0.00–0.15)
Total Protein, Urine: 25 mg/dL

## 2019-03-06 LAB — URIC ACID: Uric Acid, Serum: 7.9 mg/dL — ABNORMAL HIGH (ref 2.5–7.1)

## 2019-03-06 SURGERY — Surgical Case
Anesthesia: Spinal

## 2019-03-06 MED ORDER — LABETALOL HCL 5 MG/ML IV SOLN
20.0000 mg | INTRAVENOUS | Status: DC | PRN
  Administered 2019-03-06: 20 mg via INTRAVENOUS
  Filled 2019-03-06: qty 4

## 2019-03-06 MED ORDER — OXYTOCIN 40 UNITS IN NORMAL SALINE INFUSION - SIMPLE MED
INTRAVENOUS | Status: AC
  Filled 2019-03-06: qty 1000

## 2019-03-06 MED ORDER — SODIUM CHLORIDE 0.9% FLUSH: 3.0000 mL | INTRAVENOUS | Status: DC | PRN

## 2019-03-06 MED ORDER — SOD CITRATE-CITRIC ACID 500-334 MG/5ML PO SOLN
ORAL | Status: AC
  Filled 2019-03-06: qty 30

## 2019-03-06 MED ORDER — MAGNESIUM SULFATE 40 GM/1000ML IV SOLN: 2.0000 g/h | INTRAVENOUS | Status: DC

## 2019-03-06 MED ORDER — ONDANSETRON HCL 4 MG/2ML IJ SOLN
INTRAMUSCULAR | Status: DC | PRN
  Administered 2019-03-06: 4 mg via INTRAVENOUS

## 2019-03-06 MED ORDER — MENTHOL 3 MG MT LOZG
1.0000 | LOZENGE | OROMUCOSAL | Status: DC | PRN
  Filled 2019-03-06: qty 9

## 2019-03-06 MED ORDER — OXYTOCIN 40 UNITS IN NORMAL SALINE INFUSION - SIMPLE MED
INTRAVENOUS | Status: DC | PRN
  Administered 2019-03-06: 800 mL via INTRAVENOUS

## 2019-03-06 MED ORDER — BUPIVACAINE HCL (PF) 0.5 % IJ SOLN
INTRAMUSCULAR | Status: AC
  Filled 2019-03-06: qty 30

## 2019-03-06 MED ORDER — BISACODYL 10 MG RE SUPP
10.0000 mg | Freq: Every day | RECTAL | Status: DC | PRN
  Filled 2019-03-06: qty 1

## 2019-03-06 MED ORDER — KETOROLAC TROMETHAMINE 30 MG/ML IJ SOLN
30.0000 mg | Freq: Four times a day (QID) | INTRAMUSCULAR | Status: AC
  Filled 2019-03-06: qty 1

## 2019-03-06 MED ORDER — MORPHINE SULFATE (PF) 0.5 MG/ML IJ SOLN
INTRAMUSCULAR | Status: DC | PRN
  Administered 2019-03-06: .1 mg via INTRATHECAL

## 2019-03-06 MED ORDER — DIPHENHYDRAMINE HCL 25 MG PO CAPS: 25.0000 mg | ORAL_CAPSULE | ORAL | Status: DC | PRN

## 2019-03-06 MED ORDER — ONDANSETRON HCL 4 MG/2ML IJ SOLN
4.0000 mg | Freq: Three times a day (TID) | INTRAMUSCULAR | Status: DC | PRN
  Administered 2019-03-07: 4 mg via INTRAVENOUS

## 2019-03-06 MED ORDER — NALBUPHINE HCL 10 MG/ML IJ SOLN: 5.0000 mg | INTRAMUSCULAR | Status: DC | PRN

## 2019-03-06 MED ORDER — DIBUCAINE (PERIANAL) 1 % EX OINT: 1.0000 "application " | TOPICAL_OINTMENT | CUTANEOUS | Status: DC | PRN

## 2019-03-06 MED ORDER — NALBUPHINE HCL 10 MG/ML IJ SOLN: 5.0000 mg | Freq: Once | INTRAMUSCULAR | Status: DC | PRN

## 2019-03-06 MED ORDER — SODIUM CHLORIDE (PF) 0.9 % IJ SOLN
INTRAMUSCULAR | Status: AC
  Filled 2019-03-06: qty 50

## 2019-03-06 MED ORDER — TETANUS-DIPHTH-ACELL PERTUSSIS 5-2.5-18.5 LF-MCG/0.5 IM SUSP: 0.5000 mL | Freq: Once | INTRAMUSCULAR | Status: DC

## 2019-03-06 MED ORDER — SIMETHICONE 80 MG PO CHEW
80.0000 mg | CHEWABLE_TABLET | Freq: Three times a day (TID) | ORAL | Status: DC
  Administered 2019-03-06 – 2019-03-08 (×5): 80 mg via ORAL
  Filled 2019-03-06 (×5): qty 1

## 2019-03-06 MED ORDER — SIMETHICONE 80 MG PO CHEW: 80.0000 mg | CHEWABLE_TABLET | ORAL | Status: DC | PRN

## 2019-03-06 MED ORDER — NALOXONE HCL 4 MG/10ML IJ SOLN
1.0000 ug/kg/h | INTRAVENOUS | Status: DC | PRN
  Filled 2019-03-06: qty 5

## 2019-03-06 MED ORDER — BUPIVACAINE LIPOSOME 1.3 % IJ SUSP
INTRAMUSCULAR | Status: AC
  Filled 2019-03-06: qty 20

## 2019-03-06 MED ORDER — NALOXONE HCL 0.4 MG/ML IJ SOLN: 0.4000 mg | INTRAMUSCULAR | Status: DC | PRN

## 2019-03-06 MED ORDER — IBUPROFEN 800 MG PO TABS: 800.0000 mg | ORAL_TABLET | Freq: Four times a day (QID) | ORAL | Status: DC

## 2019-03-06 MED ORDER — KETOROLAC TROMETHAMINE 30 MG/ML IJ SOLN: 30.0000 mg | Freq: Four times a day (QID) | INTRAMUSCULAR | Status: AC

## 2019-03-06 MED ORDER — BUPIVACAINE IN DEXTROSE 0.75-8.25 % IT SOLN
INTRATHECAL | Status: DC | PRN
  Administered 2019-03-06: 1.6 mL via INTRATHECAL

## 2019-03-06 MED ORDER — SOD CITRATE-CITRIC ACID 500-334 MG/5ML PO SOLN
30.0000 mL | ORAL | Status: AC
  Administered 2019-03-06: 30 mL via ORAL

## 2019-03-06 MED ORDER — INFLUENZA VAC SPLIT QUAD 0.5 ML IM SUSY
0.5000 mL | PREFILLED_SYRINGE | INTRAMUSCULAR | Status: AC
  Administered 2019-03-08: 0.5 mL via INTRAMUSCULAR
  Filled 2019-03-06: qty 0.5

## 2019-03-06 MED ORDER — LABETALOL HCL 5 MG/ML IV SOLN: 80.0000 mg | INTRAVENOUS | Status: DC | PRN

## 2019-03-06 MED ORDER — LACTATED RINGERS IV SOLN: INTRAVENOUS | Status: DC

## 2019-03-06 MED ORDER — SIMETHICONE 80 MG PO CHEW
80.0000 mg | CHEWABLE_TABLET | ORAL | Status: DC
  Administered 2019-03-07 (×2): 80 mg via ORAL
  Filled 2019-03-06 (×2): qty 1

## 2019-03-06 MED ORDER — ACETAMINOPHEN 500 MG PO TABS
1000.0000 mg | ORAL_TABLET | Freq: Four times a day (QID) | ORAL | Status: DC
  Administered 2019-03-06 – 2019-03-08 (×7): 1000 mg via ORAL
  Filled 2019-03-06 (×8): qty 2

## 2019-03-06 MED ORDER — WITCH HAZEL-GLYCERIN EX PADS: 1.0000 "application " | MEDICATED_PAD | CUTANEOUS | Status: DC | PRN

## 2019-03-06 MED ORDER — MEPERIDINE HCL 25 MG/ML IJ SOLN: 6.2500 mg | INTRAMUSCULAR | Status: DC | PRN

## 2019-03-06 MED ORDER — SENNOSIDES-DOCUSATE SODIUM 8.6-50 MG PO TABS
2.0000 | ORAL_TABLET | ORAL | Status: DC
  Administered 2019-03-07 (×2): 2 via ORAL
  Filled 2019-03-06 (×2): qty 2

## 2019-03-06 MED ORDER — SODIUM CHLORIDE 0.9 % IV SOLN
INTRAVENOUS | Status: DC | PRN
  Administered 2019-03-06: 50 ug/min via INTRAVENOUS

## 2019-03-06 MED ORDER — COCONUT OIL OIL
1.0000 "application " | TOPICAL_OIL | Status: DC | PRN
  Administered 2019-03-07: 1 via TOPICAL
  Filled 2019-03-06: qty 120

## 2019-03-06 MED ORDER — FENTANYL CITRATE (PF) 100 MCG/2ML IJ SOLN
INTRAMUSCULAR | Status: DC | PRN
  Administered 2019-03-06: 15 ug via INTRATHECAL

## 2019-03-06 MED ORDER — SODIUM CHLORIDE 0.9 % IV SOLN
INTRAVENOUS | Status: DC | PRN
  Administered 2019-03-06: 40 mL

## 2019-03-06 MED ORDER — FERROUS SULFATE 325 (65 FE) MG PO TABS
325.0000 mg | ORAL_TABLET | Freq: Two times a day (BID) | ORAL | Status: DC
  Administered 2019-03-06 – 2019-03-08 (×4): 325 mg via ORAL
  Filled 2019-03-06 (×4): qty 1

## 2019-03-06 MED ORDER — CEFAZOLIN SODIUM-DEXTROSE 2-3 GM-%(50ML) IV SOLR
INTRAVENOUS | Status: DC | PRN
  Administered 2019-03-06: 2 g via INTRAVENOUS

## 2019-03-06 MED ORDER — BUPIVACAINE HCL (PF) 0.25 % IJ SOLN
INTRAMUSCULAR | Status: DC | PRN
  Administered 2019-03-06: 30 mL

## 2019-03-06 MED ORDER — DIPHENHYDRAMINE HCL 25 MG PO CAPS: 25.0000 mg | ORAL_CAPSULE | Freq: Four times a day (QID) | ORAL | Status: DC | PRN

## 2019-03-06 MED ORDER — BUPIVACAINE LIPOSOME 1.3 % IJ SUSP: 20.0000 mL | Freq: Once | INTRAMUSCULAR | Status: DC

## 2019-03-06 MED ORDER — DIPHENHYDRAMINE HCL 50 MG/ML IJ SOLN: 12.5000 mg | INTRAMUSCULAR | Status: DC | PRN

## 2019-03-06 MED ORDER — LACTATED RINGERS IV SOLN: Freq: Once | INTRAVENOUS | Status: AC

## 2019-03-06 MED ORDER — LABETALOL HCL 5 MG/ML IV SOLN
40.0000 mg | INTRAVENOUS | Status: DC | PRN
  Administered 2019-03-06: 15:00:00 40 mg via INTRAVENOUS
  Filled 2019-03-06: qty 8

## 2019-03-06 MED ORDER — MEASLES, MUMPS & RUBELLA VAC IJ SOLR: 0.5000 mL | Freq: Once | INTRAMUSCULAR | Status: DC

## 2019-03-06 MED ORDER — MAGNESIUM SULFATE BOLUS VIA INFUSION
4.0000 g | Freq: Once | INTRAVENOUS | Status: DC
  Filled 2019-03-06: qty 1000

## 2019-03-06 MED ORDER — PRENATAL MULTIVITAMIN CH
1.0000 | ORAL_TABLET | Freq: Every day | ORAL | Status: DC
  Administered 2019-03-08: 1 via ORAL
  Filled 2019-03-06: qty 1

## 2019-03-06 MED ORDER — SODIUM CHLORIDE 0.9% FLUSH
INTRAVENOUS | Status: DC | PRN
  Administered 2019-03-06: 50 mL via INTRAVENOUS

## 2019-03-06 MED ORDER — FLEET ENEMA 7-19 GM/118ML RE ENEM: 1.0000 | ENEMA | Freq: Every day | RECTAL | Status: DC | PRN

## 2019-03-06 MED ORDER — CEFAZOLIN SODIUM-DEXTROSE 2-4 GM/100ML-% IV SOLN
2.0000 g | INTRAVENOUS | Status: DC
  Filled 2019-03-06: qty 100

## 2019-03-06 MED ORDER — KETOROLAC TROMETHAMINE 30 MG/ML IJ SOLN
30.0000 mg | Freq: Four times a day (QID) | INTRAMUSCULAR | Status: AC
  Administered 2019-03-06 (×2): 30 mg via INTRAVENOUS
  Filled 2019-03-06 (×2): qty 1

## 2019-03-06 MED ORDER — HYDRALAZINE HCL 20 MG/ML IJ SOLN: 10.0000 mg | INTRAMUSCULAR | Status: DC | PRN

## 2019-03-06 MED ORDER — DEXAMETHASONE SODIUM PHOSPHATE 10 MG/ML IJ SOLN
INTRAMUSCULAR | Status: DC | PRN
  Administered 2019-03-06: 10 mg via INTRAVENOUS

## 2019-03-06 MED ORDER — OXYTOCIN 40 UNITS IN NORMAL SALINE INFUSION - SIMPLE MED
2.5000 [IU]/h | INTRAVENOUS | Status: AC
  Filled 2019-03-06: qty 1000

## 2019-03-06 MED ORDER — GABAPENTIN 300 MG PO CAPS
300.0000 mg | ORAL_CAPSULE | Freq: Every day | ORAL | Status: DC
  Administered 2019-03-06 – 2019-03-07 (×2): 300 mg via ORAL
  Filled 2019-03-06 (×3): qty 1

## 2019-03-06 MED ORDER — OXYCODONE HCL 5 MG PO TABS
5.0000 mg | ORAL_TABLET | ORAL | Status: DC | PRN
  Administered 2019-03-07: 10 mg via ORAL
  Administered 2019-03-07: 5 mg via ORAL
  Administered 2019-03-08 (×4): 10 mg via ORAL
  Filled 2019-03-06 (×6): qty 2

## 2019-03-06 MED ORDER — ACETAMINOPHEN 325 MG PO TABS: 650.0000 mg | ORAL_TABLET | Freq: Four times a day (QID) | ORAL | Status: DC

## 2019-03-06 SURGICAL SUPPLY — 28 items
BARRIER ADHS 3X4 INTERCEED (GAUZE/BANDAGES/DRESSINGS) ×2 IMPLANT
CANISTER SUCT 3000ML PPV (MISCELLANEOUS) ×2 IMPLANT
CHLORAPREP W/TINT 26 (MISCELLANEOUS) ×2 IMPLANT
COVER WAND RF STERILE (DRAPES) ×2 IMPLANT
DRSG TELFA 3X8 NADH (GAUZE/BANDAGES/DRESSINGS) ×2 IMPLANT
ELECT REM PT RETURN 9FT ADLT (ELECTROSURGICAL) ×2
ELECTRODE REM PT RTRN 9FT ADLT (ELECTROSURGICAL) ×1 IMPLANT
GAUZE SPONGE 4X4 12PLY STRL (GAUZE/BANDAGES/DRESSINGS) ×2 IMPLANT
GOWN STRL REUS W/ TWL LRG LVL3 (GOWN DISPOSABLE) ×3 IMPLANT
GOWN STRL REUS W/TWL LRG LVL3 (GOWN DISPOSABLE) ×3
HANDLE YANKAUER SUCT BULB TIP (MISCELLANEOUS) ×2 IMPLANT
NEEDLE HYPO 25GX1X1/2 BEV (NEEDLE) ×2 IMPLANT
NS IRRIG 1000ML POUR BTL (IV SOLUTION) ×2 IMPLANT
PACK C SECTION AR (MISCELLANEOUS) ×2 IMPLANT
PAD OB MATERNITY 4.3X12.25 (PERSONAL CARE ITEMS) ×2 IMPLANT
PAD PREP 24X41 OB/GYN DISP (PERSONAL CARE ITEMS) ×2 IMPLANT
PENCIL SMOKE ULTRAEVAC 22 CON (MISCELLANEOUS) ×2 IMPLANT
RTRCTR C-SECT PINK 25CM LRG (MISCELLANEOUS) ×2 IMPLANT
STAPLER INSORB 30 2030 C-SECTI (MISCELLANEOUS) ×2 IMPLANT
SUT MNCRL 4-0 (SUTURE) ×1
SUT MNCRL 4-0 27XMFL (SUTURE) ×1
SUT VIC AB 0 CT1 36 (SUTURE) ×4 IMPLANT
SUT VIC AB 0 CTX 36 (SUTURE) ×2
SUT VIC AB 0 CTX36XBRD ANBCTRL (SUTURE) ×2 IMPLANT
SUT VIC AB 2-0 SH 27 (SUTURE) ×2
SUT VIC AB 2-0 SH 27XBRD (SUTURE) ×2 IMPLANT
SUTURE MNCRL 4-0 27XMF (SUTURE) ×1 IMPLANT
SYR 30ML LL (SYRINGE) ×4 IMPLANT

## 2019-03-06 NOTE — Anesthesia Procedure Notes (Signed)
Spinal  Patient location during procedure: OR Start time: 03/06/2019 12:25 PM End time: 03/06/2019 12:30 PM Staffing Performed: other anesthesia staff  Anesthesiologist: Emmie Niemann, MD Resident/CRNA: Rolla Plate, CRNA Other anesthesia staff: Harlow Ohms, RN Preanesthetic Checklist Completed: patient identified, IV checked, site marked, risks and benefits discussed, surgical consent, monitors and equipment checked, pre-op evaluation and timeout performed Spinal Block Patient position: sitting Prep: Betadine Patient monitoring: heart rate, continuous pulse ox and blood pressure Approach: midline Location: L4-5 Injection technique: single-shot Needle Needle type: Introducer and Pencil-Tip  Needle gauge: 24 G Needle length: 9 cm Additional Notes Negative paresthesia. Negative blood return. Positive free-flowing CSF. Expiration date of kit checked and confirmed. Patient tolerated procedure well, without complications.

## 2019-03-06 NOTE — Discharge Instructions (Signed)
Cesarean Delivery, Care After This sheet gives you information about how to care for yourself after your procedure. Your health care provider may also give you more specific instructions. If you have problems or questions, contact your health care provider. What can I expect after the procedure? After the procedure, it is common to have:  A small amount of blood or clear fluid coming from the incision.  Some redness, swelling, and pain in your incision area.  Some abdominal pain and soreness.  Vaginal bleeding (lochia). Even though you did not have a vaginal delivery, you will still have vaginal bleeding and discharge.  Pelvic cramps.  Fatigue. You may have pain, swelling, and discomfort in the tissue between your vagina and your anus (perineum) if:  Your C-section was unplanned, and you were allowed to labor and push.  An incision was made in the area (episiotomy) or the tissue tore during attempted vaginal delivery. Follow these instructions at home: Incision care   Follow instructions from your health care provider about how to take care of your incision. Make sure you: ? Wash your hands with soap and water before you change your bandage (dressing). If soap and water are not available, use hand sanitizer. ? If you have a dressing, change it or remove it as told by your health care provider. ? Leave stitches (sutures), skin staples, skin glue, or adhesive strips in place. These skin closures may need to stay in place for 2 weeks or longer. If adhesive strip edges start to loosen and curl up, you may trim the loose edges. Do not remove adhesive strips completely unless your health care provider tells you to do that.  Check your incision area every day for signs of infection. Check for: ? More redness, swelling, or pain. ? More fluid or blood. ? Warmth. ? Pus or a bad smell.  Do not take baths, swim, or use a hot tub until your health care provider says it's okay. Ask your health  care provider if you can take showers.  When you cough or sneeze, hug a pillow. This helps with pain and decreases the chance of your incision opening up (dehiscing). Do this until your incision heals. Medicines  Take over-the-counter and prescription medicines only as told by your health care provider.  If you were prescribed an antibiotic medicine, take it as told by your health care provider. Do not stop taking the antibiotic even if you start to feel better.  Do not drive or use heavy machinery while taking prescription pain medicine. Lifestyle  Do not drink alcohol. This is especially important if you are breastfeeding or taking pain medicine.  Do not use any products that contain nicotine or tobacco, such as cigarettes, e-cigarettes, and chewing tobacco. If you need help quitting, ask your health care provider. Eating and drinking  Drink at least 8 eight-ounce glasses of water every day unless told not to by your health care provider. If you breastfeed, you may need to drink even more water.  Eat high-fiber foods every day. These foods may help prevent or relieve constipation. High-fiber foods include: ? Whole grain cereals and breads. ? Brown rice. ? Beans. ? Fresh fruits and vegetables. Activity   If possible, have someone help you care for your baby and help with household activities for at least a few days after you leave the hospital.  Return to your normal activities as told by your health care provider. Ask your health care provider what activities are safe for   you.  Rest as much as possible. Try to rest or take a nap while your baby is sleeping.  Do not lift anything that is heavier than 10 lbs (4.5 kg), or the limit that you were told, until your health care provider says that it is safe.  Talk with your health care provider about when you can engage in sexual activity. This may depend on your: ? Risk of infection. ? How fast you heal. ? Comfort and desire to  engage in sexual activity. General instructions  Do not use tampons or douches until your health care provider approves.  Wear loose, comfortable clothing and a supportive and well-fitting bra.  Keep your perineum clean and dry. Wipe from front to back when you use the toilet.  If you pass a blood clot, save it and call your health care provider to discuss. Do not flush blood clots down the toilet before you get instructions from your health care provider.  Keep all follow-up visits for you and your baby as told by your health care provider. This is important. Contact a health care provider if:  You have: ? A fever. ? Bad-smelling vaginal discharge. ? Pus or a bad smell coming from your incision. ? Difficulty or pain when urinating. ? A sudden increase or decrease in the frequency of your bowel movements. ? More redness, swelling, or pain around your incision. ? More fluid or blood coming from your incision. ? A rash. ? Nausea. ? Little or no interest in activities you used to enjoy. ? Questions about caring for yourself or your baby.  Your incision feels warm to the touch.  Your breasts turn red or become painful or hard.  You feel unusually sad or worried.  You vomit.  You pass a blood clot from your vagina.  You urinate more than usual.  You are dizzy or light-headed. Get help right away if:  You have: ? Pain that does not go away or get better with medicine. ? Chest pain. ? Difficulty breathing. ? Blurred vision or spots in your vision. ? Thoughts about hurting yourself or your baby. ? New pain in your abdomen or in one of your legs. ? A severe headache.  You faint.  You bleed from your vagina so much that you fill more than one sanitary pad in one hour. Bleeding should not be heavier than your heaviest period. Summary  After the procedure, it is common to have pain at your incision site, abdominal cramping, and slight bleeding from your vagina.  Check  your incision area every day for signs of infection.  Tell your health care provider about any unusual symptoms.  Keep all follow-up visits for you and your baby as told by your health care provider. This information is not intended to replace advice given to you by your health care provider. Make sure you discuss any questions you have with your health care provider. Document Revised: 08/25/2017 Document Reviewed: 08/25/2017 Elsevier Patient Education  2020 [employer]  

## 2019-03-06 NOTE — Progress Notes (Signed)
Pt present for scheduled cesarean section with B.Beasley, MD.

## 2019-03-06 NOTE — H&P (Signed)
Obstetric Preoperative History and Physical  Wendy Robbins is a 34 y.o. G2P1001 with IUP at [redacted]w[redacted]d presenting for scheduled cesarean section.  No acute concerns.   Prenatal Course Source of Care: [facility]  Pregnancy complications or risks:    Elevated 1h OGTT  182  [x]  3h OGTT: 78, 184*, 117, 43 WNL  Anemia  hgb 9.9 at 29w  Elevated b/p affecting pregnancy (New diagnosis at [redacted]w[redacted]d)  02/22/19: b/p 154/76, repeat 150/98; sent to L&D for e valuation   02/27/19: BP 148/90, recommend induction immediately but patient declined, would like to wait till after 2020. Position now breech, see belwo  Breech  Position    Patient Active Problem List   Diagnosis Date Noted  . Breech presentation, no version 03/06/2019  . Gestational hypertension w/o significant proteinuria in 3rd trimester 02/22/2019   She plans to breastfeed She desires Depo-Provera for postpartum contraception.   Prenatal labs and studies: ABO, Rh: --/--/B POS (01/04 01-08-1967) Antibody: NEG (01/04 0936)  Blood type/Rh B pos  Antibody screen neg  Rubella Immune  Varicella Immune  RPR NR  HBsAg Neg  HIV NR  GC neg  Chlamydia neg  Genetic screening negative  1 hour GTT 182  3 hour GTT 78, 184*, 117, 43 WNL  GBS neg    10/20/18: Anatomy: normal, Presentation: breech, FHR: 146bpm, Previa: none, Placenta: anterior, Adnexa: b/l ovaries imaged, appear WNL, CL: 3.72cm, SIUP, S=D  Past Medical History:  Diagnosis Date  . Anemia   . Asthma     Past Surgical History:  Procedure Laterality Date  . WISDOM TOOTH EXTRACTION      OB History  Gravida Para Term Preterm AB Living  2 1 1  0 0 1  SAB TAB Ectopic Multiple Live Births               # Outcome Date GA Lbr Len/2nd Weight Sex Delivery Anes PTL Lv  2 Current           1 Term             Social History   Socioeconomic History  . Marital status: Single    Spouse name: Not on file  . Number of children: Not on file  . Years of education: Not on file   . Highest education level: Not on file  Occupational History  . Not on file  Tobacco Use  . Smoking status: Never Smoker  . Smokeless tobacco: Never Used  Substance and Sexual Activity  . Alcohol use: Not Currently  . Drug use: Not Currently  . Sexual activity: Yes    Birth control/protection: Injection  Other Topics Concern  . Not on file  Social History Narrative  . Not on file   Social Determinants of Health   Financial Resource Strain:   . Difficulty of Paying Living Expenses: Not on file  Food Insecurity:   . Worried About 10/22/18 in the Last Year: Not on file  . Ran Out of Food in the Last Year: Not on file  Transportation Needs:   . Lack of Transportation (Medical): Not on file  . Lack of Transportation (Non-Medical): Not on file  Physical Activity:   . Days of Exercise per Week: Not on file  . Minutes of Exercise per Session: Not on file  Stress:   . Feeling of Stress : Not on file  Social Connections:   . Frequency of Communication with Friends and Family: Not on file  . Frequency  of Social Gatherings with Friends and Family: Not on file  . Attends Religious Services: Not on file  . Active Member of Clubs or Organizations: Not on file  . Attends Banker Meetings: Not on file  . Marital Status: Not on file    History reviewed. No pertinent family history.  Medications Prior to Admission  Medication Sig Dispense Refill Last Dose  . ferrous sulfate 325 (65 FE) MG EC tablet Take 325 mg by mouth 3 (three) times daily with meals.   03/05/2019 at Unknown time  . Prenatal Vit-Fe Fumarate-FA (MULTIVITAMIN-PRENATAL) 27-0.8 MG TABS tablet Take 1 tablet by mouth daily at 12 noon.   03/05/2019 at Unknown time  . albuterol (PROVENTIL HFA;VENTOLIN HFA) 108 (90 Base) MCG/ACT inhaler Inhale 1-2 puffs into the lungs every 6 (six) hours as needed for wheezing or shortness of breath. (Patient not taking: Reported on 02/22/2019) 1 Inhaler 2 Not Taking at  Unknown time  . chlorpheniramine-HYDROcodone (TUSSIONEX PENNKINETIC ER) 10-8 MG/5ML SUER Take 5 mLs by mouth every 12 (twelve) hours as needed for cough. (Patient not taking: Reported on 02/22/2019) 115 mL 0 Not Taking at Unknown time  . oseltamivir (TAMIFLU) 75 MG capsule Take 1 capsule (75 mg total) by mouth 2 (two) times daily. (Patient not taking: Reported on 02/22/2019) 10 capsule 0 Not Taking at Unknown time    Allergies  Allergen Reactions  . Shellfish-Derived Products Anaphylaxis  . Shellfish Allergy     Review of Systems: Negative except for what is mentioned in HPI.  Physical Exam: BP (!) 143/91 (BP Location: Left Arm)   Pulse 80   Temp 98.3 F (36.8 C) (Oral)   Resp 18   Ht 5\' 11"  (1.803 m)   Wt 81.2 kg   BMI 24.97 kg/m  FHR by Doppler: 145 bpm CONSTITUTIONAL: Well-developed, well-nourished female in no acute distress.  NECK: Normal range of motion, supple, no masses SKIN: Skin is warm and dry. No rash noted. Not diaphoretic. No erythema. No pallor.  NEUROLGIC: Alert and oriented to person, place, and time. Normal reflexes, muscle tone coordination.  PSYCHIATRIC: Normal mood and affect. Normal behavior. Normal judgment and thought content. CARDIOVASCULAR: Normal heart rate noted, regular rhythm RESPIRATORY: Effort and breath sounds normal, no problems with respiration noted ABDOMEN: Soft, nontender, nondistended, gravid. Well-healed Pfannenstiel incision. PELVIC: Deferred MUSCULOSKELETAL: Normal range of motion. No edema and no tenderness.    Pertinent Labs/Studies:   Results for orders placed or performed during the hospital encounter of 03/06/19 (from the past 72 hour(s))  Type and screen [facility]     Status: None   Collection Time: 03/06/19  9:36 AM  Result Value Ref Range   ABO/RH(D) B POS    Antibody Screen NEG    Sample Expiration      03/09/2019,2359 Performed at [facility], [address] [place], [place]      Assessment and Plan :Wendy Robbins is a 34 y.o. G2P1001 at 38+4wks for gHTN and breech presentation, baby girl "Justice". I have previously discussed with the patient included but were not limited to: bleeding which may require transfusion or reoperation; infection which may require antibiotics; injury to bowel, bladder, ureters or other surrounding organs; injury to the fetus; need for additional procedures including hysterectomy in the event of a life-threatening hemorrhage; placental abnormalities wth subsequent pregnancies, incisional problems, thromboembolic phenomenon and other postoperative/anesthesia complications. The patient concurred with the proposed plan, giving informed written consent for the procedure. Patient  has been NPO since last night she will remain NPO for procedure. Anesthesia and OR aware. Preoperative prophylactic antibiotics and SCDs ordered on call to the OR. To OR when ready.    Benjaman Kindler, MD, MPH, Cherlynn June

## 2019-03-06 NOTE — Progress Notes (Signed)
RN notified provider of new drainage on patient dressing. Provider came to patient bedside, held pressure for 6 minutes and replaced wound dressing with new pressure dressing.

## 2019-03-06 NOTE — Op Note (Signed)
  Cesarean Section Procedure Note  Date of procedure: 03/06/2019   Pre-operative Diagnosis: Intrauterine pregnancy at [redacted]w[redacted]d;  - gHTN - breech presentation  Post-operative Diagnosis: same, delivered.  Procedure: Primary Low Transverse Cesarean Section through Pfannenstiel incision  Surgeon: Christeen Douglas  Assistant(s):  Margaretmary Eddy  Anesthesia: Spinal anesthesia  Estimated Blood Loss:          Total IV Fluids:  Urine Output: see anesthesia report         Specimens: none         Complications:  None; patient tolerated the procedure well.         Disposition: PACU - hemodynamically stable.         Condition: stable  Findings:  A female infant "Justice" in frank breech presentation. Amniotic fluid - Clear  Birth weight 3330 g.  Apgars of 9 and 9 at one and five minutes respectively.  Intact placenta with a three-vessel cord.  Grossly normal uterus, tubes and ovaries bilaterally. No intraabdominal adhesions were noted.  Indications: malpresentation: frank breech  Procedure Details  The patient was taken to Operating Room, identified as the correct patient and the procedure verified as C-Section Delivery. A formal Time Out was held with all team members present and in agreement.  After induction of spinal anesthesia, the patient was draped and prepped in the usual sterile manner. A Pfannenstiel skin incision was made and carried down through the subcutaneous tissue to the fascia. Fascial incision was made and extended transversely with the Mayo scissors. The fascia was separated from the underlying rectus tissue superiorly and inferiorly. The peritoneum was identified and entered bluntly. Peritoneal incision was extended longitudinally. The utero-vesical peritoneal reflection was incised transversely and a bladder flap was created digitally.   A low transverse hysterotomy was made. The fetus was delivered atraumatically using standard breech manuvers. The umbilical  cord was clamped x2 and cut and the infant was handed to the awaiting pediatricians. The placenta was removed intact and appeared normal, intact, and with a 3-vessel cord.   The uterus was exteriorized and cleared of all clot and debris. The hysterotomy was closed with running sutures of 0-Vicryl. A second imbricating layer was placed with the same suture. Excellent hemostasis was observed. The peritoneal cavity was cleared of all clots and debris. The uterus was returned to the abdomen.   The pelvis was irrigated and again, excellent hemostasis was noted. The fascia was then reapproximated with running sutures of 0 Vicryl.  The skin was reapproximated with a 4-0 Monocryl subcuticular stitch.  Instrument, sponge, and needle counts were correct prior to the abdominal closure and at the conclusion of the case.   The patient tolerated the procedure well and was transferred to the recovery room in stable condition.   Christeen Douglas, MD 03/06/2019

## 2019-03-06 NOTE — Transfer of Care (Signed)
Immediate Anesthesia Transfer of Care Note  Patient: Wendy Robbins  Procedure(s) Performed: CESAREAN SECTION (N/A )  Patient Location: L/D  Anesthesia Type:Spinal  Level of Consciousness: awake, alert  and oriented  Airway & Oxygen Therapy: Patient Spontanous Breathing  Post-op Assessment: Report given to RN and Post -op Vital signs reviewed and stable  Post vital signs: Reviewed  Last Vitals:  Vitals Value Taken Time  BP 144/99 03/06/19 1344  Temp 36.7 C 03/06/19 1344  Pulse 77 03/06/19 1344  Resp 16 03/06/19 1344  SpO2 99 % 03/06/19 1344    Last Pain:  Vitals:   03/06/19 0950  TempSrc:   PainSc: 0-No pain         Complications: No apparent anesthesia complications

## 2019-03-06 NOTE — Discharge Summary (Signed)
Obstetrical Discharge Summary  Patient Name: Wendy Robbins DOB: 02/16/1986 MRN: [id number]  Date of Admission: 03/06/2019 Date of Discharge: 03/08/2019  Primary OB: [facility]   Gestational Age at Delivery: [redacted]w[redacted]d   Antepartum complications:   ? Elevated 1h OGTT  182  [x]  3h OGTT: 78, 184*, 117, 43 WNL ? Anemia  hgb 9.9 at 29w ? Elevated b/p affecting pregnancy (New diagnosis at [redacted]w[redacted]d)  02/22/19: b/p 154/76, repeat 150/98; sent to L&D for e valuation   02/27/19: BP 148/90, recommend induction immediately but patient declined, would like to wait till after 2020.  ? Breech  Position ?  Admitting Diagnosis: gHTN Secondary Diagnosis:breech presentation Patient Active Problem List   Diagnosis Date Noted  . Breech presentation, no version 03/06/2019  . Gestational hypertension 03/06/2019  . Gestational hypertension w/o significant proteinuria in 3rd trimester 02/22/2019    Intrapartum complications/course:   Uncomplicated pLTCS  Date of Delivery: 03/06/2019 Delivered By: 05/04/2019 Delivery Type: primary cesarean section, low transverse incision Anesthesia: spinal Placenta: Spontaneous  Newborn Data: Live born female "Justice" Birth Weight: 7 lb 5.5 oz (3330 g) APGAR: 9, 9  Newborn Delivery   Birth date/time: 03/06/2019 12:58:00 Delivery type: C-Section, Low Transverse Trial of labor: No C-section categorization: Primary       Brief Hospital Course: (Cesarean Section): Wendy Robbins is a Ginny Forth who underwent cesarean section on 03/06/2019.  Patient had an uncomplicated initial surgery; please refer to the operative note. Pt was taken back to the OR by Dr 05/04/2019 on POD#1 for dehiscence of the skin incision with excessive blood loss and exploratory Lap was performed, see Op notes. She was given 2 units of PRBCs, she was asymptomatic and had normal orthostatic vital signs on POD#2.  By time of discharge on POD#2, her pain was controlled on oral pain  medications; she had appropriate lochia and was ambulating, voiding without difficulty, tolerating regular diet and passing flatus.   She was deemed stable for discharge to home.     Discharge Physical Exam:  BP 132/82 (BP Location: Right Arm)   Pulse 78   Temp 98.6 F (37 C) (Oral)   Resp 18   Ht 5\' 11"  (1.803 m)   Wt 81.2 kg   SpO2 100%   Breastfeeding Unknown   BMI 24.97 kg/m   General: NAD CV: RRR Pulm: CTABL, nl effort ABD: s/nd/nt, fundus firm and below the umbilicus Lochia: moderate Incision: c/d/i  DVT Evaluation: LE non-ttp, no evidence of DVT on exam.  Hemoglobin  Date Value Ref Range Status  03/08/2019 6.9 (L) 12.0 - 15.0 g/dL Final   HCT  Date Value Ref Range Status  03/08/2019 20.6 (L) 36.0 - 46.0 % Final    Post partum course: see Op notes  Postpartum Procedures: Expl Lap of Hysterotomy on 03/07/19  Disposition: stable, discharge to home. Baby Feeding: breastmilk Baby Disposition: home with mom  Rh Immune globulin given: n/a Rubella vaccine given: n/a  Tdap vaccine given in AP or PP setting: given 01/02/19 Flu vaccine given in AP or PP setting: declined  Contraception: Depo  Prenatal Labs: Blood type/Rh --/--/B POS (01/04 0936)  Antibody screen neg  Rubella Immune  Varicella Immune  RPR NR  HBsAg Neg  HIV NR  GC neg  Chlamydia neg  Genetic screening negative  1 hour GTT 182  3 hour GTT 78, 184*, 117, 43 WNL  GBS neg     Plan:  13/2/20 was discharged to home in good  condition. Follow-up appointment at [facility] with delivering provider in 2 weeks   Discharge Medications: Allergies as of 03/08/2019      Reactions   Shellfish-derived Products Anaphylaxis   Shellfish Allergy       Medication List    STOP taking these medications   albuterol 108 (90 Base) MCG/ACT inhaler Commonly known as: VENTOLIN HFA   chlorpheniramine-HYDROcodone 10-8 MG/5ML Suer Commonly known as: Tussionex Pennkinetic ER   ferrous  sulfate 325 (65 FE) MG EC tablet Replaced by: ferrous sulfate 325 (65 FE) MG tablet   oseltamivir 75 MG capsule Commonly known as: Tamiflu     TAKE these medications   acetaminophen 500 MG tablet Commonly known as: TYLENOL Take 2 tablets (1,000 mg total) by mouth every 6 (six) hours.   ascorbic acid 500 MG tablet Commonly known as: VITAMIN C Take 1 tablet (500 mg total) by mouth 2 (two) times daily with a meal.   ferrous sulfate 325 (65 FE) MG tablet Take 1 tablet (325 mg total) by mouth 2 (two) times daily with a meal. Replaces: ferrous sulfate 325 (65 FE) MG EC tablet   multivitamin-prenatal 27-0.8 MG Tabs tablet Take 1 tablet by mouth daily at 12 noon.   oxyCODONE 5 MG immediate release tablet Commonly known as: Oxy IR/ROXICODONE Take 1 tablet (5 mg total) by mouth every 6 (six) hours as needed for up to 7 days for moderate pain.   senna-docusate 8.6-50 MG tablet Commonly known as: Senokot-S Take 2 tablets by mouth daily. Start taking on: March 09, 2019   simethicone 80 MG chewable tablet Commonly known as: MYLICON Chew 1 tablet (80 mg total) by mouth 3 (three) times daily after meals for 7 days.       Follow-up Information    Benjaman Kindler, MD. Go on 03/23/2019.   Specialty: Obstetrics and Gynecology Why: please go to follow up appointment on thursday 1/21 at 4:15 with dr Leafy Ro for an incision check Contact information: [place] [address] [phone]           Signed: Francetta Found, [place] 03/08/2019 7:05 PM

## 2019-03-06 NOTE — Anesthesia Preprocedure Evaluation (Signed)
Anesthesia Evaluation  Patient identified by MRN, date of birth, ID band Patient awake    Reviewed: Allergy & Precautions, NPO status , Patient's Chart, lab work & pertinent test results  History of Anesthesia Complications Negative for: history of anesthetic complications  Airway Mallampati: II  TM Distance: >3 FB Neck ROM: Full    Dental no notable dental hx.    Pulmonary asthma ,    breath sounds clear to auscultation- rhonchi (-) wheezing      Cardiovascular hypertension, (-) CAD, (-) Past MI, (-) Cardiac Stents and (-) CABG  Rhythm:Regular Rate:Normal - Systolic murmurs and - Diastolic murmurs    Neuro/Psych neg Seizures negative neurological ROS  negative psych ROS   GI/Hepatic negative GI ROS, Neg liver ROS,   Endo/Other  negative endocrine ROSneg diabetes  Renal/GU negative Renal ROS     Musculoskeletal negative musculoskeletal ROS (+)   Abdominal (+) - obese,   Peds  Hematology negative hematology ROS (+) anemia ,   Anesthesia Other Findings Past Medical History: No date: Anemia No date: Asthma  Breech presentation  Reproductive/Obstetrics (+) Pregnancy                             Lab Results  Component Value Date   WBC 11.2 (H) 03/02/2019   HGB 12.8 03/02/2019   HCT 38.9 03/02/2019   MCV 93.7 03/02/2019   PLT 243 03/02/2019    Anesthesia Physical Anesthesia Plan  ASA: II  Anesthesia Plan: Spinal   Post-op Pain Management:    Induction:   PONV Risk Score and Plan: 2 and Ondansetron  Airway Management Planned: Natural Airway  Additional Equipment:   Intra-op Plan:   Post-operative Plan:   Informed Consent: I have reviewed the patients History and Physical, chart, labs and discussed the procedure including the risks, benefits and alternatives for the proposed anesthesia with the patient or authorized representative who has indicated his/her understanding  and acceptance.     Dental advisory given  Plan Discussed with: CRNA and Anesthesiologist  Anesthesia Plan Comments:         Anesthesia Quick Evaluation

## 2019-03-07 ENCOUNTER — Encounter
Admission: RE | Disposition: A | Discharge status: Home / Self Care | Payer: Self-pay | Source: Home / Self Care | Attending: Obstetrics and Gynecology

## 2019-03-07 ENCOUNTER — Inpatient Hospital Stay: Payer: Medicaid Other | Admitting: Anesthesiology

## 2019-03-07 HISTORY — PX: LAPAROTOMY: SHX154

## 2019-03-07 HISTORY — PX: HEMATOMA EVACUATION: SHX5118

## 2019-03-07 LAB — CBC WITH DIFFERENTIAL/PLATELET
Abs Immature Granulocytes: 0.23 10*3/uL — ABNORMAL HIGH (ref 0.00–0.07)
Basophils Absolute: 0 10*3/uL (ref 0.0–0.1)
Basophils Relative: 0 %
Eosinophils Absolute: 0 10*3/uL (ref 0.0–0.5)
Eosinophils Relative: 0 %
HCT: 22.1 % — ABNORMAL LOW (ref 36.0–46.0)
Hemoglobin: 7.5 g/dL — ABNORMAL LOW (ref 12.0–15.0)
Immature Granulocytes: 2 %
Lymphocytes Relative: 8 %
Lymphs Abs: 1.2 10*3/uL (ref 0.7–4.0)
MCH: 31 pg (ref 26.0–34.0)
MCHC: 33.9 g/dL (ref 30.0–36.0)
MCV: 91.3 fL (ref 80.0–100.0)
Monocytes Absolute: 0.8 10*3/uL (ref 0.1–1.0)
Monocytes Relative: 5 %
Neutro Abs: 13.6 10*3/uL — ABNORMAL HIGH (ref 1.7–7.7)
Neutrophils Relative %: 85 %
Platelets: 134 10*3/uL — ABNORMAL LOW (ref 150–400)
RBC: 2.42 MIL/uL — ABNORMAL LOW (ref 3.87–5.11)
RDW: 15.4 % (ref 11.5–15.5)
WBC: 15.9 10*3/uL — ABNORMAL HIGH (ref 4.0–10.5)
nRBC: 0 % (ref 0.0–0.2)

## 2019-03-07 LAB — CBC
HCT: 20.5 % — ABNORMAL LOW (ref 36.0–46.0)
Hemoglobin: 6.9 g/dL — ABNORMAL LOW (ref 12.0–15.0)
MCH: 32.1 pg (ref 26.0–34.0)
MCHC: 33.7 g/dL (ref 30.0–36.0)
MCV: 95.3 fL (ref 80.0–100.0)
Platelets: 197 10*3/uL (ref 150–400)
RBC: 2.15 MIL/uL — ABNORMAL LOW (ref 3.87–5.11)
RDW: 14.8 % (ref 11.5–15.5)
WBC: 18.3 10*3/uL — ABNORMAL HIGH (ref 4.0–10.5)
nRBC: 0.1 % (ref 0.0–0.2)

## 2019-03-07 LAB — PREPARE RBC (CROSSMATCH)

## 2019-03-07 SURGERY — LAPAROTOMY, EXPLORATORY
Anesthesia: General

## 2019-03-07 MED ORDER — FENTANYL CITRATE (PF) 100 MCG/2ML IJ SOLN
INTRAMUSCULAR | Status: DC | PRN
  Administered 2019-03-07: 25 ug via INTRAVENOUS
  Administered 2019-03-07: 75 ug via INTRAVENOUS

## 2019-03-07 MED ORDER — OXYCODONE-ACETAMINOPHEN 5-325 MG PO TABS
1.0000 | ORAL_TABLET | ORAL | Status: DC | PRN
  Administered 2019-03-07: 1 via ORAL
  Filled 2019-03-07: qty 1

## 2019-03-07 MED ORDER — HYDROMORPHONE HCL 1 MG/ML IJ SOLN
INTRAMUSCULAR | Status: DC | PRN
  Administered 2019-03-07 (×2): .5 mg via INTRAVENOUS

## 2019-03-07 MED ORDER — SUCCINYLCHOLINE CHLORIDE 20 MG/ML IJ SOLN
INTRAMUSCULAR | Status: DC | PRN
  Administered 2019-03-07: 100 mg via INTRAVENOUS

## 2019-03-07 MED ORDER — SODIUM CHLORIDE 0.9 % IV SOLN
INTRAVENOUS | Status: DC | PRN
  Administered 2019-03-07: 20 ug/min via INTRAVENOUS
  Administered 2019-03-07: 30 ug/min via INTRAVENOUS

## 2019-03-07 MED ORDER — EPHEDRINE SULFATE 50 MG/ML IJ SOLN
INTRAMUSCULAR | Status: DC | PRN
  Administered 2019-03-07 (×2): 5 mg via INTRAVENOUS

## 2019-03-07 MED ORDER — FENTANYL CITRATE (PF) 100 MCG/2ML IJ SOLN
INTRAMUSCULAR | Status: AC
  Filled 2019-03-07: qty 2

## 2019-03-07 MED ORDER — GLYCOPYRROLATE 0.2 MG/ML IJ SOLN
INTRAMUSCULAR | Status: DC | PRN
  Administered 2019-03-07: .2 mg via INTRAVENOUS

## 2019-03-07 MED ORDER — ESMOLOL HCL 100 MG/10ML IV SOLN
INTRAVENOUS | Status: DC | PRN
  Administered 2019-03-07: 20 mg via INTRAVENOUS

## 2019-03-07 MED ORDER — PROPOFOL 10 MG/ML IV BOLUS
INTRAVENOUS | Status: DC | PRN
  Administered 2019-03-07: 170 mg via INTRAVENOUS

## 2019-03-07 MED ORDER — OXYCODONE HCL 5 MG PO TABS: 5.0000 mg | ORAL_TABLET | Freq: Once | ORAL | Status: AC | PRN

## 2019-03-07 MED ORDER — CEFAZOLIN SODIUM-DEXTROSE 2-4 GM/100ML-% IV SOLN
2.0000 g | INTRAVENOUS | Status: AC
  Administered 2019-03-07: 2 g via INTRAVENOUS
  Filled 2019-03-07: qty 100

## 2019-03-07 MED ORDER — PHENYLEPHRINE HCL (PRESSORS) 10 MG/ML IV SOLN
INTRAVENOUS | Status: DC | PRN
  Administered 2019-03-07 (×3): 100 ug via INTRAVENOUS

## 2019-03-07 MED ORDER — LIDOCAINE HCL (CARDIAC) PF 100 MG/5ML IV SOSY
PREFILLED_SYRINGE | INTRAVENOUS | Status: DC | PRN
  Administered 2019-03-07: 80 mg via INTRAVENOUS

## 2019-03-07 MED ORDER — SOD CITRATE-CITRIC ACID 500-334 MG/5ML PO SOLN
ORAL | Status: AC
  Administered 2019-03-07: 15 mL via ORAL
  Filled 2019-03-07: qty 15

## 2019-03-07 MED ORDER — SOD CITRATE-CITRIC ACID 500-334 MG/5ML PO SOLN
30.0000 mL | Freq: Three times a day (TID) | ORAL | Status: DC
  Filled 2019-03-07: qty 30

## 2019-03-07 MED ORDER — OXYCODONE HCL 5 MG PO TABS
ORAL_TABLET | ORAL | Status: AC
  Administered 2019-03-07: 5 mg via ORAL
  Filled 2019-03-07: qty 1

## 2019-03-07 MED ORDER — LACTATED RINGERS IV SOLN: INTRAVENOUS | Status: DC | PRN

## 2019-03-07 MED ORDER — SODIUM CHLORIDE 0.9 % IV SOLN: INTRAVENOUS | Status: DC | PRN

## 2019-03-07 MED ORDER — MIDAZOLAM HCL 2 MG/2ML IJ SOLN
INTRAMUSCULAR | Status: DC | PRN
  Administered 2019-03-07: 2 mg via INTRAVENOUS

## 2019-03-07 MED ORDER — SOD CITRATE-CITRIC ACID 500-334 MG/5ML PO SOLN: 15.0000 mL | Freq: Once | ORAL | Status: DC

## 2019-03-07 MED ORDER — PROMETHAZINE HCL 25 MG/ML IJ SOLN: 6.2500 mg | INTRAMUSCULAR | Status: DC | PRN

## 2019-03-07 MED ORDER — SODIUM CHLORIDE 0.9% IV SOLUTION: Freq: Once | INTRAVENOUS | Status: DC

## 2019-03-07 MED ORDER — ALBUMIN HUMAN 5 % IV SOLN: INTRAVENOUS | Status: DC | PRN

## 2019-03-07 MED ORDER — DEXAMETHASONE SODIUM PHOSPHATE 10 MG/ML IJ SOLN
INTRAMUSCULAR | Status: DC | PRN
  Administered 2019-03-07: 10 mg via INTRAVENOUS

## 2019-03-07 MED ORDER — FENTANYL CITRATE (PF) 100 MCG/2ML IJ SOLN
25.0000 ug | INTRAMUSCULAR | Status: DC | PRN
  Administered 2019-03-07: 25 ug via INTRAVENOUS

## 2019-03-07 MED ORDER — ROCURONIUM BROMIDE 100 MG/10ML IV SOLN
INTRAVENOUS | Status: DC | PRN
  Administered 2019-03-07: 50 mg via INTRAVENOUS
  Administered 2019-03-07: 30 mg via INTRAVENOUS

## 2019-03-07 MED ORDER — SUGAMMADEX SODIUM 500 MG/5ML IV SOLN
INTRAVENOUS | Status: DC | PRN
  Administered 2019-03-07: 380 mg via INTRAVENOUS

## 2019-03-07 MED ORDER — HYDROMORPHONE HCL 1 MG/ML IJ SOLN: 0.6000 mg | INTRAMUSCULAR | Status: DC | PRN

## 2019-03-07 MED ORDER — ALBUMIN HUMAN 5 % IV SOLN
INTRAVENOUS | Status: AC
  Filled 2019-03-07: qty 250

## 2019-03-07 MED ORDER — HYDROMORPHONE HCL 1 MG/ML IJ SOLN: 0.4000 mg | INTRAMUSCULAR | Status: DC | PRN

## 2019-03-07 MED ORDER — OXYCODONE HCL 5 MG/5ML PO SOLN: 5.0000 mg | Freq: Once | ORAL | Status: AC | PRN

## 2019-03-07 MED ORDER — VASOPRESSIN 20 UNIT/ML IV SOLN
INTRAVENOUS | Status: DC | PRN
  Administered 2019-03-07 (×2): 2 m[IU] via INTRAVENOUS

## 2019-03-07 SURGICAL SUPPLY — 43 items
BARRIER ADHS 3X4 INTERCEED (GAUZE/BANDAGES/DRESSINGS) ×4 IMPLANT
CANISTER SUCT 1200ML W/VALVE (MISCELLANEOUS) ×4 IMPLANT
CHLORAPREP W/TINT 26 (MISCELLANEOUS) ×4 IMPLANT
COVER WAND RF STERILE (DRAPES) ×4 IMPLANT
DRAPE LAP W/FLUID (DRAPES) IMPLANT
DRAPE LAPAROTOMY T 102X78X121 (DRAPES) ×4 IMPLANT
DRAPE UNDER BUTTOCK W/FLU (DRAPES) ×4 IMPLANT
DRSG OPSITE POSTOP 4X10 (GAUZE/BANDAGES/DRESSINGS) ×4 IMPLANT
DRSG TELFA 3X8 NADH (GAUZE/BANDAGES/DRESSINGS) ×4 IMPLANT
ELECT CAUTERY BLADE 6.4 (BLADE) IMPLANT
ELECT REM PT RETURN 9FT ADLT (ELECTROSURGICAL) ×4
ELECTRODE REM PT RTRN 9FT ADLT (ELECTROSURGICAL) ×2 IMPLANT
GAUZE 4X4 16PLY RFD (DISPOSABLE) ×4 IMPLANT
GAUZE SPONGE 4X4 12PLY STRL (GAUZE/BANDAGES/DRESSINGS) ×4 IMPLANT
GLOVE BIO SURGEON STRL SZ7 (GLOVE) ×4 IMPLANT
GLOVE INDICATOR 7.5 STRL GRN (GLOVE) ×20 IMPLANT
GOWN STRL REUS W/ TWL LRG LVL3 (GOWN DISPOSABLE) ×4 IMPLANT
GOWN STRL REUS W/ TWL XL LVL3 (GOWN DISPOSABLE) ×2 IMPLANT
GOWN STRL REUS W/TWL LRG LVL3 (GOWN DISPOSABLE) ×4
GOWN STRL REUS W/TWL XL LVL3 (GOWN DISPOSABLE) ×2
HANDLE SUCTION POOLE (INSTRUMENTS) ×2 IMPLANT
KIT TURNOVER CYSTO (KITS) ×4 IMPLANT
LABEL OR SOLS (LABEL) ×4 IMPLANT
PACK BASIN MAJOR ARMC (MISCELLANEOUS) ×4 IMPLANT
RETRACTOR WND ALEXIS-O 25 LRG (MISCELLANEOUS) ×2 IMPLANT
RTRCTR WOUND ALEXIS O 25CM LRG (MISCELLANEOUS) ×4
SOL PREP PVP 2OZ (MISCELLANEOUS) ×4
SOLUTION PREP PVP 2OZ (MISCELLANEOUS) ×2 IMPLANT
SPONGE LAP 18X18 RF (DISPOSABLE) ×4 IMPLANT
STAPLER SKIN PROX 35W (STAPLE) IMPLANT
SUCTION POOLE HANDLE (INSTRUMENTS) ×4
SURGILUBE 2OZ TUBE FLIPTOP (MISCELLANEOUS) ×4 IMPLANT
SUT MNCRL 3-0 UNDYED SH (SUTURE) ×2 IMPLANT
SUT MONOCRYL 3-0 UNDYED (SUTURE) ×2
SUT VIC AB 0 CT1 27 (SUTURE) ×6
SUT VIC AB 0 CT1 27XCR 8 STRN (SUTURE) ×6 IMPLANT
SUT VIC AB 0 CT1 36 (SUTURE) ×8 IMPLANT
SUT VIC AB 2-0 SH 27 (SUTURE) ×8
SUT VIC AB 2-0 SH 27XBRD (SUTURE) ×8 IMPLANT
SUT VICRYL PLUS ABS 0 54 (SUTURE) ×4 IMPLANT
SYR BULB IRRIG 60ML STRL (SYRINGE) ×4 IMPLANT
TRAY FOLEY MTR SLVR 16FR STAT (SET/KITS/TRAYS/PACK) ×4 IMPLANT
WATER STERILE IRR 1000ML POUR (IV SOLUTION) ×4 IMPLANT

## 2019-03-07 NOTE — Anesthesia Postprocedure Evaluation (Signed)
Anesthesia Post Note  Patient: Wendy Robbins  Procedure(s) Performed: CESAREAN SECTION (N/A )  Patient location during evaluation: Mother Baby Anesthesia Type: Spinal Level of consciousness: oriented and awake and alert Pain management: pain level controlled Vital Signs Assessment: post-procedure vital signs reviewed and stable Respiratory status: spontaneous breathing and respiratory function stable Cardiovascular status: blood pressure returned to baseline and stable Postop Assessment: no headache, no backache, no apparent nausea or vomiting and able to ambulate Anesthetic complications: no     Last Vitals:  Vitals:   03/07/19 0730 03/07/19 0731  BP:  100/60  Pulse:  85  Resp: 16   Temp: 37.3 C   SpO2:  100%    Last Pain:  Vitals:   03/07/19 0730  TempSrc: Oral  PainSc:                  Starling Manns

## 2019-03-07 NOTE — Anesthesia Preprocedure Evaluation (Addendum)
Anesthesia Evaluation  Patient identified by MRN, date of birth, ID band Patient awake    Reviewed: Allergy & Precautions, H&P , NPO status , Patient's Chart, lab work & pertinent test results  Airway Mallampati: III  TM Distance: >3 FB Neck ROM: full    Dental  (+) Teeth Intact   Pulmonary asthma ,           Cardiovascular hypertension (gestational),      Neuro/Psych negative neurological ROS  negative psych ROS   GI/Hepatic negative GI ROS, Neg liver ROS,   Endo/Other  negative endocrine ROS  Renal/GU      Musculoskeletal   Abdominal   Peds  Hematology  (+) Blood dyscrasia, anemia ,   Anesthesia Other Findings S/p c/s yesterday, now with brisk incisional bleeding and drop in Hgb from 10.8 to 6.9 overnight, soaking multiple pads. Per surgeon case cannot wait until pt is NPO appropriate, will proceed emergently to OR. Will administer pRBC intraoperatively.  Past Medical History: No date: Anemia No date: Asthma  Past Surgical History: No date: WISDOM TOOTH EXTRACTION  BMI    Body Mass Index: 24.97 kg/m      Reproductive/Obstetrics negative OB ROS                            Anesthesia Physical Anesthesia Plan  ASA: III and emergent  Anesthesia Plan: General ETT and Rapid Sequence   Post-op Pain Management:    Induction:   PONV Risk Score and Plan: Ondansetron, Dexamethasone, Midazolam and Treatment may vary due to age or medical condition  Airway Management Planned: Video Laryngoscope Planned  Additional Equipment:   Intra-op Plan:   Post-operative Plan:   Informed Consent: I have reviewed the patients History and Physical, chart, labs and discussed the procedure including the risks, benefits and alternatives for the proposed anesthesia with the patient or authorized representative who has indicated his/her understanding and acceptance.     Dental Advisory  Given  Plan Discussed with: Anesthesiologist  Anesthesia Plan Comments:        Anesthesia Quick Evaluation

## 2019-03-07 NOTE — Progress Notes (Signed)
Postoperatively, bleeding from incision on right, requiring dressing change x2 overnight, with an open incision on the right noted overnight repaired with steristrips. Bleeding from incision continuing, with a drop in hgb. Plan for return to OR for wound exam under anesthesia, possible ex lap. Her vaginal bleeding is minimal, fundus firm and appropriately tender.  She is otherwise asx.  Vitals:   03/07/19 0731 03/07/19 0800  BP: 100/60 106/65  Pulse: 85 80  Resp:    Temp:    SpO2: 100% 100%   We discussed risks/benefits, including damage to bladder, bowel and possible hysterectomy, and pt consents.

## 2019-03-07 NOTE — Progress Notes (Signed)
S: Midwife called to room to assess pressure bandage and bleeding from incision. Patient denies dizziness, states pain is well controlled.   O: Pressure bandage was reinforced with abd pad by RN.  Pressure bandage and abd pad  are saturated.  Active bleeding noted from right side of incision.   Vitals:   03/07/19 0730 03/07/19 0731  BP:  100/60  Pulse:  85  Resp: 16   Temp: 99.1 F (37.3 C)   SpO2:  100%   CBC Latest Ref Rng & Units 03/07/2019 03/06/2019 03/02/2019  WBC 4.0 - 10.5 K/uL 18.3(H) 17.4(H) 11.2(H)  Hemoglobin 12.0 - 15.0 g/dL 6.9(L) 10.8(L) 12.8  Hematocrit 36.0 - 46.0 % 20.5(L) 32.9(L) 38.9  Platelets 150 - 400 K/uL 197 225 243   A: Acute blood loss from surgical incision  P: Dr. Feliberto Gottron notified of active bleeding and CBC.  Plan discussed with Dr. Dalbert Garnet.  Dr. Dalbert Garnet en route to evaluate incision and bleeding in OR.  OR notified and orders placed for 2 grams Ancef IV on call to OR and 1 unit of PRBC.  Patient and partner updated on plan of care.  Reviewed risks/benefits and potential for administration of blood products.    Gustavo Lah, CNM

## 2019-03-07 NOTE — Transfer of Care (Signed)
Immediate Anesthesia Transfer of Care Note  Patient: Wendy Robbins  Procedure(s) Performed: EXPLORATORY LAPAROTOMY (N/A )  Patient Location: PACU  Anesthesia Type:General  Level of Consciousness: awake, alert , oriented and patient cooperative  Airway & Oxygen Therapy: Patient Spontanous Breathing  Post-op Assessment: Report given to RN and Post -op Vital signs reviewed and stable  Post vital signs: Reviewed and stable  Last Vitals:  Vitals Value Taken Time  BP 132/65 03/07/19 1029  Temp    Pulse 108 03/07/19 1032  Resp 15 03/07/19 1032  SpO2 97 % 03/07/19 1032  Vitals shown include unvalidated device data.  Last Pain:  Vitals:   03/07/19 0745  TempSrc:   PainSc: 0-No pain         Complications: No apparent anesthesia complications

## 2019-03-07 NOTE — Anesthesia Postprocedure Evaluation (Signed)
Anesthesia Post Note  Patient: Wendy Robbins  Procedure(s) Performed: EXPLORATORY LAPAROTOMY (N/A ) EVACUATION HEMATOMA-HYSTEROTOMY OVERSEW  Patient location during evaluation: PACU Anesthesia Type: General Level of consciousness: awake and alert and oriented Pain management: pain level controlled Vital Signs Assessment: post-procedure vital signs reviewed and stable Respiratory status: spontaneous breathing, nonlabored ventilation and respiratory function stable Cardiovascular status: blood pressure returned to baseline and stable Postop Assessment: no signs of nausea or vomiting Anesthetic complications: no     Last Vitals:  Vitals:   03/07/19 1109 03/07/19 1115  BP:  121/79  Pulse: 98 93  Resp: 16 13  Temp:  36.4 C  SpO2: 100% 100%    Last Pain:  Vitals:   03/07/19 1115  TempSrc:   PainSc: 6                  Emmamae Mcnamara

## 2019-03-07 NOTE — Lactation Note (Signed)
This note was copied from a baby's chart. Lactation Consultation Note  Patient Name: Wendy Robbins [id number] Date: 03/07/2019 Reason for consult: Follow-up assessment  LC in to see mom and assist with breastfeeding attempt. Baby was wrapped in blanket, with mom attempting to latch in cradle hold. Baby sneezed multiple time with clear fluid discharged, and the burped loudly. Baby still sounded stuffy, but began to cue. LC unwrapped baby from blanket, and helped mom into comfortable position bringing baby to mom tummy to tummy, nose opposite of nipple. Mom independently sandwiched tissue and brought baby in tightly. Baby grasped breast easily and began strong rhythmic sucking. Baby did pop on and off the breast several times before maintaining latch and staying at the breast. LC reviewed breastfeeding basics, newborn stomach size, feeding behaviors and frequency, alignment at breast, wide open mouth, and flanged top/bottom lips. Encouraged mom to continue seeking breastfeeding support as needed.  Maternal Data Formula Feeding for Exclusion: No Has patient been taught Hand Expression?: Yes Does the patient have breastfeeding experience prior to this delivery?: No(2nd baby; 1st time BF)  Feeding Feeding Type: Breast Fed Nipple Type: Slow - flow  LATCH Score Latch: Grasps breast easily, tongue down, lips flanged, rhythmical sucking.  Audible Swallowing: A few with stimulation  Type of Nipple: Everted at rest and after stimulation  Comfort (Breast/Nipple): Soft / non-tender  Hold (Positioning): Assistance needed to correctly position infant at breast and maintain latch.  LATCH Score: 8  Interventions Interventions: Breast feeding basics reviewed;Assisted with latch;Breast compression;Support pillows;Adjust position  Lactation Tools Discussed/Used     Consult Status Consult Status: Follow-up Date: 03/07/19 Follow-up type: In-patient    [name] 03/07/2019, 2:53  PM

## 2019-03-07 NOTE — Op Note (Addendum)
Cesarean Section Procedure Note  Date of procedure: 03/07/2019   Pre-operative Diagnosis: Postoperative bleeding from the incision with skin separation and a significant drop in hgb  Post-operative Diagnosis: same  Procedure: Exploratory laparotomy; evacuation of intraperitoneal clots; oversew of hysterotomy   Surgeon: Christeen Douglas, MD  Assistant(s):  CST  Anesthesia: General LMA anesthesia  Anesthesiologist: Alver Fisher, MD Anesthesiologist: Alver Fisher, MD; Karleen Hampshire, MD CRNA: Mohammed Kindle, CRNA  Estimated Blood Loss:  less than 100 mL         Drains: Foley during the case, removed prior to leaving the OR         Total IV Fluids: crystalloid, 2 u pRBC, albumin  Urine Output:         Specimens: none         Complications:  None; patient tolerated the procedure well.         Disposition: PACU - hemodynamically stable.         Condition: stable  Findings: Open skin separation from right angle to the midline, and all the Insorb staples were easily opened without cutting.  They appeared to be at an obtuse angle, and not entirely closed.  No active bleeding in the subq space. Both superficial epigastric arteries intact. Intact fascia. Large clots below the fascia but no active bleeding intrabdominally.  Hysterotomy remained closed, with no active bleeding along the uterine incision.  No active bleeding found intraperitoneally.  No rectus hematomas were noted.  The bladder was intact throughout.  The cause of her overnight bleeding was most likely her wound separation at the level of the skin, and it appeared that the Insorb staples did not close entirely when they were placed.  Yesterday at the end of the procedure her skin edges were reapproximated and closed. We did place a pressure dressing above.  However, today the entire incision line was easily opened with minimal force.  Indications: Wound separation with significant  bleeding  Procedure Details  The patient was taken to Operating Room. A formal Time Out was held with all team members present and in agreement.  After induction of anesthesia, the patient was draped and prepped in the usual sterile manner.  Examination of the skin incision revealed several centimeters of opening with bright red bleeding on the right side.  The incision was opened completely, and the subcutaneous tissue examined.  This area was irrigated, and no significant bright red bleeding was noted.  Small clots were removed.   Given the amount of bleeding that she had overnight, the decision was made to explore under the fascia.  The fascial sutures were intact.  These were opened sharply, and significant clots were removed from the space between the rectus muscles.  The rectus muscles were examined in detail, and no rectus hematoma or rectus bleeding was noted.  The bladder was noted to be intact with no bleeding.  An Alexis self-retaining retractor was placed over the hysterotomy.    After removal of clots above the hysterotomy, the suture line was identified.  Both angles were identified, and grasped with a snap.  The broad ligament was identified and examined bilaterally.  The adnexa were examined bilaterally carefully, and no bleeding was noted.  The bilateral pericolic gutters were evacuated of thin blood, and the uterus was moved to the incision to fully examine the posterior cul-de-sac.  The cul-de-sac had minimal bleeding, and this was evacuated.  The uterus was placed back in situ, and was oversewn  in a running locked fashion from angle to angle, being careful to avoid the bladder and the broad ligaments.  Again, the upper abdomen and bilateral cul-de-sacs were examined, with minimal rundown noted.  No bleeding noted in the mesosalpinx, and her omentum was not visualized.  The pelvis was irrigated and again, excellent hemostasis was noted. The fascia was then reapproximated with running  sutures of 0 Vicryl. The subcutaneous tissue was reapproximated with running sutures of 0 Vicryl, after assuring subq hemostasis. The skin was reapproximated with a 4-0 Monocryl subcuticular stitch.  The skin had Steri-Strips applied, and a honeycomb dressing above to monitor for bleeding.  Instrument, sponge, and needle counts were correct prior to the abdominal closure and at the conclusion of the case.   The patient tolerated the procedure well and was transferred to the recovery room in stable condition.   During the case, the patient did receive 2 units of packed red blood cells and 500 mL of albumin.  Benjaman Kindler, MD 03/07/2019

## 2019-03-07 NOTE — Progress Notes (Signed)
S: Midwife called to patient's room to inspect dressing.  Patient rates pain 5/10 but states that her incision does not hurt any more than usual.    O: Pressure dressing was saturated.  Upon removal it was noticed that the incision's skin closure was open.  The fascia closure was still intact.  RN stated that she had changed the dressing earlier in her shift and the incision was closed.    A: Partial surgical wound dehiscence - right side      Fascia closure intact.  P: Dr. Feliberto Gottron notified of surgical wound dehiscence.  Patient declined pain medication prior to dressing change.  States that she feels pressure but the pain is tolerable.  Incision closed under sterile technique with steri strips, covered with telfa, 4x4, abd pad, and tape. Patient tolerated dressing change well.   Will continue to monitor incision for continued bleeding.   Gustavo Lah, CNM

## 2019-03-07 NOTE — Anesthesia Procedure Notes (Signed)
Procedure Name: Intubation Performed by: Fletcher-Harrison, Harmoney Sienkiewicz, CRNA Pre-anesthesia Checklist: Patient identified, Emergency Drugs available, Suction available and Patient being monitored Patient Re-evaluated:Patient Re-evaluated prior to induction Oxygen Delivery Method: Circle system utilized Preoxygenation: Pre-oxygenation with 100% oxygen Induction Type: IV induction, Rapid sequence and Cricoid Pressure applied Laryngoscope Size: McGraph and 3 Grade View: Grade I Tube type: Oral Tube size: 6.5 mm Number of attempts: 1 Airway Equipment and Method: Stylet Placement Confirmation: ETT inserted through vocal cords under direct vision,  positive ETCO2,  CO2 detector and breath sounds checked- equal and bilateral Secured at: 21 cm Tube secured with: Tape Dental Injury: Teeth and Oropharynx as per pre-operative assessment        

## 2019-03-07 NOTE — Anesthesia Post-op Follow-up Note (Signed)
  Anesthesia Pain Follow-up Note  Patient: Wendy Robbins  Day #: 1  Date of Follow-up: 03/07/2019 Time: 7:36 AM  Last Vitals:  Vitals:   03/07/19 0730 03/07/19 0731  BP:  100/60  Pulse:  85  Resp: 16   Temp: 37.3 C   SpO2:  100%    Level of Consciousness: alert  Pain: none   Side Effects:None  Catheter Site Exam:clean, dry, no drainage     Plan: D/C from anesthesia care at surgeon's request  Starling Manns

## 2019-03-07 NOTE — Lactation Note (Signed)
This note was copied from a baby's chart. Lactation Consultation Note  Patient Name: Wendy Robbins [id number] Date: 03/07/2019 Reason for consult: Initial assessment   Nurse requested LC visit for mom who has recently returned from repeat surgery. [facility] student entered room to mom who is still tired after surgery and baby asleep in the basinet. Mom reported a couple of good feeds yesterday, but that she has not been able to feed today due to surgery. Partner attempted to feed baby some formula while Mom was gone, but said that baby did not like the bottle.   Early feeding cues were reviewed with parents, and they were encouraged to call lactation for support during future feeds or to explore different positions for comfort during feeding. LC student re-swaddled baby, and encouraged mom to rest until baby was cuing.      Maternal Data Formula Feeding for Exclusion: No  Feeding Feeding Type: Bottle Fed - Formula(given while mom in surgery) Nipple Type: Slow - flow  LATCH Score                   Interventions Interventions: Breast feeding basics reviewed;Position options  Lactation Tools Discussed/Used     Consult Status Consult Status: Follow-up Date: 03/07/19 Follow-up type: In-patient    [name] 03/07/2019, 12:08 PM

## 2019-03-07 NOTE — Progress Notes (Signed)
Paged Tobi Bastos, CNM at [facility] in regards to patient having an increase in bleeding from c-section site. CNM arrived, new treatment completed by CNM with RN assistance.   Changed washcloth that was placed for drainage on four different occasions within 3.5 hours. Paged Tobi Bastos, CNM to inform her of continuous leakage. No new orders were given at the time.   VSS, pt alert and oriented x4. Denies any pain or dizziness at this time. Will continue current plan of care and monitor frequently.

## 2019-03-08 LAB — TYPE AND SCREEN
ABO/RH(D): B POS
Antibody Screen: NEGATIVE
Unit division: 0
Unit division: 0

## 2019-03-08 LAB — BPAM RBC
Blood Product Expiration Date: 202101182359
Blood Product Expiration Date: 202101182359
ISSUE DATE / TIME: 202101050810
ISSUE DATE / TIME: 202101050810
Unit Type and Rh: 7300
Unit Type and Rh: 7300

## 2019-03-08 LAB — CBC
HCT: 20 % — ABNORMAL LOW (ref 36.0–46.0)
HCT: 20.6 % — ABNORMAL LOW (ref 36.0–46.0)
Hemoglobin: 6.9 g/dL — ABNORMAL LOW (ref 12.0–15.0)
Hemoglobin: 6.9 g/dL — ABNORMAL LOW (ref 12.0–15.0)
MCH: 31.8 pg (ref 26.0–34.0)
MCH: 31.2 pg (ref 26.0–34.0)
MCHC: 34.5 g/dL (ref 30.0–36.0)
MCHC: 33.5 g/dL (ref 30.0–36.0)
MCV: 92.2 fL (ref 80.0–100.0)
MCV: 93.2 fL (ref 80.0–100.0)
Platelets: 136 10*3/uL — ABNORMAL LOW (ref 150–400)
Platelets: 142 10*3/uL — ABNORMAL LOW (ref 150–400)
RBC: 2.17 MIL/uL — ABNORMAL LOW (ref 3.87–5.11)
RBC: 2.21 MIL/uL — ABNORMAL LOW (ref 3.87–5.11)
RDW: 16.7 % — ABNORMAL HIGH (ref 11.5–15.5)
RDW: 16.9 % — ABNORMAL HIGH (ref 11.5–15.5)
WBC: 16 10*3/uL — ABNORMAL HIGH (ref 4.0–10.5)
WBC: 14.1 10*3/uL — ABNORMAL HIGH (ref 4.0–10.5)
nRBC: 0.1 % (ref 0.0–0.2)
nRBC: 0.4 % — ABNORMAL HIGH (ref 0.0–0.2)

## 2019-03-08 MED ORDER — ASCORBIC ACID 500 MG PO TABS
500.0000 mg | ORAL_TABLET | Freq: Two times a day (BID) | ORAL | Status: DC
  Administered 2019-03-08 (×2): 500 mg via ORAL
  Filled 2019-03-08 (×2): qty 1

## 2019-03-08 MED ORDER — FERROUS SULFATE 325 (65 FE) MG PO TABS: 325.0000 mg | ORAL_TABLET | Freq: Two times a day (BID) | ORAL | 0 refills | Status: DC

## 2019-03-08 MED ORDER — ACETAMINOPHEN 500 MG PO TABS: 1000.0000 mg | ORAL_TABLET | Freq: Four times a day (QID) | ORAL | 0 refills | Status: DC

## 2019-03-08 MED ORDER — SIMETHICONE 80 MG PO CHEW: 80.0000 mg | CHEWABLE_TABLET | Freq: Three times a day (TID) | ORAL | 0 refills | Status: DC

## 2019-03-08 MED ORDER — ASCORBIC ACID 500 MG PO TABS: 500.0000 mg | ORAL_TABLET | Freq: Two times a day (BID) | ORAL | 0 refills | Status: AC

## 2019-03-08 MED ORDER — SENNOSIDES-DOCUSATE SODIUM 8.6-50 MG PO TABS: 2.0000 | ORAL_TABLET | ORAL | 0 refills | Status: DC

## 2019-03-08 MED ORDER — OXYCODONE HCL 5 MG PO TABS: 5.0000 mg | ORAL_TABLET | Freq: Four times a day (QID) | ORAL | 0 refills | Status: AC | PRN

## 2019-03-08 NOTE — Lactation Note (Signed)
This note was copied from a baby's chart. Lactation Consultation Note  Patient Name: Wendy Robbins [id number] Date: 03/08/2019 Reason for consult: Follow-up assessment  In for follow-up with Baby Wendy "Justice" and to discuss feeding plan once home. Mother reports breastfeeding is going well. She is concerned that baby is not getting enough to eat and worried about baby refusing her right breast. Has questions about when it would be OK to start formula.   Discussed that baby's having a preference for one side is normal. Encouraged her to try different positioning on the right side to see if that helps. Also discussed the importance of breast stimulation to the right side if baby is refusing. Mother has been taught hand expression, but also brought in manual pump, during this visit, for her to use at home until she receives an electric pump from [facility].  Setup demonstrated and cleaning parts explained. Will submit referral to [facility] to assist mother receiving pump.   Explained that baby's feeding behaviors are normal at this age of life. Discussed the sounds of swallowing, counting dirty and wet diapers, cluster feeding, and stool transitioning for signs of baby transferring milk and for assurance baby is eating enough. Also, affirmed mother that whenever she feels like she wants to transition to formula that this is fine for her and baby. Discussed goals for formula feeding amounts and paced bottle feeding.   Offered Lactation Services outpatient and pointed to where [facility] number is located for reference. Advised patient to call with any follow-up questions before discharge. Mother voices she understands feeding plan for going home. No further questions or concerns at this time.   Will provide services as needed and requested.   Maternal Data Formula Feeding for Exclusion: No Does the patient have breastfeeding experience prior to this delivery?: No  Feeding    LATCH Score                   Interventions Interventions: Breast feeding basics reviewed;Adjust position;Position options;Coconut oil;Hand pump;DEBP  Lactation Tools Discussed/Used WIC Program: Yes Pump Review: Setup, frequency, and cleaning(Setup manual pump ) Initiated by:: Mickle Mallory, IBCLC  Date initiated:: 03/08/19   Consult Status Consult Status: Complete Date: 03/08/19 Follow-up type: Call as needed   Corlis Hove -NCAT Lactation Student  [name] 03/08/2019, 11:36 AM

## 2019-03-08 NOTE — Progress Notes (Signed)
Post Partum Day 2 Subjective: Doing well, no complaints.  Tolerating regular diet, pain with PO meds, voiding and ambulating without difficulty. Denies any dizziness, is able to do basic ADLs and baby care without assistance.   No CP SOB Fever,Chills, N/V or leg pain; denies nipple or breast pain no HA change of vision, RUQ/epigastric pain  Objective: BP 120/71 (BP Location: Left Arm)   Pulse 84   Temp 98.6 F (37 C) (Oral)   Resp 18   Ht 5\' 11"  (1.803 m)   Wt 81.2 kg   SpO2 100%   Breastfeeding Unknown   BMI 24.97 kg/m    Physical Exam:  General: NAD Breasts: soft/nontender CV: RRR Pulm: nl effort, CTABL Abdomen: soft, NT, BS x 4 Incision: Honeycomb dsg with marked drainage noted, no changes since previous marking yesterday. No erythema noted, no drainage beyond dressing.  Lochia: small Uterine Fundus: fundus firm and 2 fb below umbilicus DVT Evaluation: no cords, ttp LEs   Recent Labs    03/07/19 1738 03/08/19 0650  HGB 7.5* 6.9*  HCT 22.1* 20.0*  WBC 15.9* 16.0*  PLT 134* 136*    Assessment/Plan: 34 y.o. G2P2002 postpartum day # 2  - Continue routine PP care, remove 2nd IV and dc fluids.  - Lactation consult prn - Reviewed contraceptive options: desires Depo.   - Acute blood loss anemia - hemodynamically stable and asymptomatic, s/p 2 units PRBCs; continue po ferrous sulfate BID with stool softeners  - Immunization status: all Imms up to date, declines Flu vaccine.    Disposition: Does desire Dc home today, due to severe anemia and  ex lap done 1/5 due to post-op bleeding and skin incision dehiscence, will reassess later this afternoon for potential DC.     05/06/19, CNM 03/08/2019  8:22 AM

## 2019-03-08 NOTE — Lactation Note (Signed)
This note was copied from a baby's chart. Lactation Consultation Note  Patient Name: Wendy Robbins [id number] Date: 03/08/2019   [facility] referral sent over per mom's request.  Maternal Data    Feeding    LATCH Score                   Interventions    Lactation Tools Discussed/Used     Consult Status      [name] 03/08/2019, 4:30 PM

## 2019-03-08 NOTE — Progress Notes (Signed)
Patient discharged home with infant. Discharge instructions, prescriptions and follow up appointment given to and reviewed with patient. Patient verbalized understanding. Pt wheeled out with infant by NT 

## 2019-03-28 MED FILL — Phenylephrine HCl IV Soln 10 MG/ML: INTRAVENOUS | Qty: 1 | Status: AC

## 2019-04-21 DIAGNOSIS — Z8742 Personal history of other diseases of the female genital tract: Secondary | ICD-10-CM

## 2019-04-24 ENCOUNTER — Ambulatory Visit (LOCAL_COMMUNITY_HEALTH_CENTER): Payer: Medicaid Other | Admitting: Family Medicine

## 2019-04-24 ENCOUNTER — Encounter: Payer: Self-pay | Admitting: Family Medicine

## 2019-04-24 ENCOUNTER — Other Ambulatory Visit: Payer: Self-pay

## 2019-04-24 VITALS — BP 131/80 | Ht 69.5 in | Wt 169.0 lb

## 2019-04-24 DIAGNOSIS — Z3009 Encounter for other general counseling and advice on contraception: Secondary | ICD-10-CM | POA: Diagnosis not present

## 2019-04-24 MED ORDER — MEDROXYPROGESTERONE ACETATE 150 MG/ML IM SUSP
150.0000 mg | INTRAMUSCULAR | Status: AC
  Administered 2019-04-24 – 2020-02-01 (×4): 150 mg via INTRAMUSCULAR

## 2019-04-24 MED ORDER — MULTIVITAMINS PO CAPS: 1.0000 | ORAL_CAPSULE | Freq: Every day | ORAL | 0 refills | Status: AC

## 2019-04-24 NOTE — Progress Notes (Signed)
Here today to start Depo. Last PE here was 03/08/2018, last Pap Smear was 03/22/2018. Had Nexplanon removed at 03/08/2018 PE. Declines all STD screening. Tawny Hopping, RN

## 2019-04-24 NOTE — Progress Notes (Signed)
Depo given and tolerated well. MVI's given declines condoms. Tawny Hopping, RN

## 2019-04-24 NOTE — Progress Notes (Signed)
Family Planning Visit  Subjective:  Wendy Robbins is a 34 y.o. being seen today for  Chief Complaint  Patient presents with  . Contraception    Depo    Pt has Atopic dermatitis on their problem list.  HPI  Patient reports she is here to re-start depo. She is 6 wks postpartum, has pp visit with [facility] tomorrow. Has not had sex since prior to giving birth.  Pt denies all of the following, which are contraindications to Depo use: Known breast cancer Pregnancy Also denies: Hypertension (CDC cat 2 if mild, cat 3 if severe) Severe cirrhosis, hepatocellular adenoma Diabetes with nephrosis or vascular complications Ischemic heart disease or multiple risk factors for atherosclerotic disease, and some forms of lupus Unexplained vaginal bleeding Pregnancy planned within the next year Long-term use of corticosteroid therapy in women with a history of, or risk factors for, nontraumatic (frailty) fractures.  Current use of aminoglutethimide (usually for the treatment of Cushing's syndrome) because aminoglutethimide may increase metabolism of progestins    Patient's last menstrual period was 04/18/2019 (exact date). Last sex: pre-giving birth BCM: none Pt desires EC? n/a  Last pap: 03/22/2018  Patient reports 1 partner(s) in last year. Do they desire STI screening (if no, why not)? no  Does the patient desire a pregnancy in the next year? no   34 y.o., Body mass index is 24.6 kg/m. - Is patient eligible for HA1C diabetes screening based on BMI and age >31?  no  Does the patient have a current or past history of drug use? no No components found for: HCV  See flowsheet for other program required questions.   There are no preventive care reminders to display for this patient.  ROS  The following portions of the patient's history were reviewed and updated as appropriate: allergies, current medications, past family history, past medical history, past social history, past surgical  history and problem list. Problem list updated.  Objective:  BP 131/80   Ht 5' 9.5" (1.765 m)   Wt 169 lb (76.7 kg)   LMP 04/18/2019 (Exact Date)   Breastfeeding No   BMI 24.60 kg/m    Physical Exam  Gen: well appearing, NAD HEENT: no scleral icterus Lung: Normal WOB Ext: warm well perfused   Assessment and Plan:  Wendy Robbins is a 34 y.o. female presenting to the [facility] Department for a well woman exam/family planning visit  Contraception counseling: Reviewed all forms of birth control options in the tiered based approach including abstinence; over the counter/barrier methods; hormonal contraceptive medication including pill, patch, ring, injection, contraceptive implant; hormonal and nonhormonal IUDs; permanent sterilization options including vasectomy and the various tubal sterilization modalities. Risks, benefits, how to discontinue and typical effectiveness rates were reviewed.  Questions were answered.  Written information was also given to the patient to review.  Patient desires depo, this was prescribed for patient. She will follow up in  3 months for surveillance.  She was told to call with any further questions, or with any concerns about this method of contraception.  Emphasized use of condoms 100% of the time for STI prevention.  Emergency Contraception: n/a    1. Family planning services -Depo rx x1 yr. Counseling as above.  -Pt has PP exam w/[facility] tomorrow - medroxyPROGESTERone (DEPO-PROVERA) injection 150 mg   Return in about 3 months (around 07/22/2019) for Depo.  No future appointments.  Ann Held, PA-C

## 2019-07-21 ENCOUNTER — Ambulatory Visit: Payer: Medicaid Other

## 2019-07-21 ENCOUNTER — Other Ambulatory Visit: Payer: Self-pay

## 2019-07-28 ENCOUNTER — Ambulatory Visit (LOCAL_COMMUNITY_HEALTH_CENTER): Payer: Medicaid Other

## 2019-07-28 ENCOUNTER — Other Ambulatory Visit: Payer: Self-pay

## 2019-07-28 VITALS — BP 129/84 | Ht 69.5 in | Wt 169.0 lb

## 2019-07-28 DIAGNOSIS — Z30013 Encounter for initial prescription of injectable contraceptive: Secondary | ICD-10-CM

## 2019-07-28 DIAGNOSIS — Z3009 Encounter for other general counseling and advice on contraception: Secondary | ICD-10-CM | POA: Diagnosis not present

## 2019-07-28 MED ORDER — MULTI-VITAMIN/MINERALS PO TABS: 1.0000 | ORAL_TABLET | Freq: Every day | ORAL | 0 refills | Status: DC

## 2019-07-28 NOTE — Progress Notes (Signed)
Depo 150mg  IM given today per order by , PA-C. Pt tolerated well. Consent on file from 04/24/19. Next depo due 10/13/19. 10/15/19, RN

## 2019-10-23 ENCOUNTER — Ambulatory Visit (LOCAL_COMMUNITY_HEALTH_CENTER): Payer: Medicaid Other | Admitting: Physician Assistant

## 2019-10-23 ENCOUNTER — Encounter: Payer: Self-pay | Admitting: Physician Assistant

## 2019-10-23 ENCOUNTER — Other Ambulatory Visit: Payer: Self-pay

## 2019-10-23 VITALS — BP 122/86 | Ht 69.5 in | Wt 175.2 lb

## 2019-10-23 DIAGNOSIS — Z3009 Encounter for other general counseling and advice on contraception: Secondary | ICD-10-CM

## 2019-10-23 DIAGNOSIS — Z3042 Encounter for surveillance of injectable contraceptive: Secondary | ICD-10-CM

## 2019-10-23 DIAGNOSIS — Z30013 Encounter for initial prescription of injectable contraceptive: Secondary | ICD-10-CM

## 2019-10-23 NOTE — Progress Notes (Signed)
Reviewed chart and patient with current order and no concerns.  RN did visit and gave Depo per 04/2019 order.

## 2019-10-23 NOTE — Progress Notes (Signed)
Pt is 12.3 weeks post depo today.  DMPA 150 mg IM administered per Samara Snide, PA order dated 04/24/19.

## 2019-10-24 ENCOUNTER — Ambulatory Visit: Payer: Medicaid Other

## 2020-02-01 ENCOUNTER — Other Ambulatory Visit: Payer: Self-pay

## 2020-02-01 ENCOUNTER — Ambulatory Visit (LOCAL_COMMUNITY_HEALTH_CENTER): Payer: Medicaid Other

## 2020-02-01 VITALS — BP 126/84 | HR 76 | Ht 69.5 in | Wt 171.5 lb

## 2020-02-01 DIAGNOSIS — Z30013 Encounter for initial prescription of injectable contraceptive: Secondary | ICD-10-CM

## 2020-02-01 DIAGNOSIS — Z3009 Encounter for other general counseling and advice on contraception: Secondary | ICD-10-CM | POA: Diagnosis not present

## 2020-02-01 NOTE — Progress Notes (Signed)
Depo administered without difficulty per 04/24/2019 written order of J. Staples PA-C and client tolerated without complaint. Folic acid counseling completed and encouraged to purchase MVI with folic acid and take per bottle directions. Counseled Depo and physical due 2/17//2022 and counseled to schedule physical for that day when appt made and to not request a Depo appt. Client verbalized understanding. Jossie Ng, RN

## 2020-04-12 ENCOUNTER — Ambulatory Visit (LOCAL_COMMUNITY_HEALTH_CENTER): Payer: Medicaid Other | Admitting: Physician Assistant

## 2020-04-12 ENCOUNTER — Other Ambulatory Visit: Payer: Self-pay

## 2020-04-12 DIAGNOSIS — Z719 Counseling, unspecified: Secondary | ICD-10-CM

## 2020-04-12 DIAGNOSIS — Z3009 Encounter for other general counseling and advice on contraception: Secondary | ICD-10-CM

## 2020-04-12 NOTE — Progress Notes (Signed)
Pt was unsure about dates of last physical and exact due date for depo. Pt elects to wait until 05/03/20 to be seen at [facility] for physical and depo. Pt is 10.1 weeks post depo today and had PP visit on 05/03/19. Pt does not desire any services today.

## 2020-04-13 NOTE — Progress Notes (Signed)
Reviewed RN note and agree that plan is acceptable for patient.

## 2020-05-30 ENCOUNTER — Ambulatory Visit (LOCAL_COMMUNITY_HEALTH_CENTER): Payer: Medicaid Other | Admitting: Advanced Practice Midwife

## 2020-05-30 ENCOUNTER — Other Ambulatory Visit: Payer: Self-pay

## 2020-05-30 ENCOUNTER — Encounter: Payer: Self-pay | Admitting: Advanced Practice Midwife

## 2020-05-30 VITALS — BP 119/74 | HR 78 | Ht 69.5 in | Wt 179.6 lb

## 2020-05-30 DIAGNOSIS — E663 Overweight: Secondary | ICD-10-CM

## 2020-05-30 DIAGNOSIS — Z3042 Encounter for surveillance of injectable contraceptive: Secondary | ICD-10-CM

## 2020-05-30 DIAGNOSIS — J45909 Unspecified asthma, uncomplicated: Secondary | ICD-10-CM | POA: Insufficient documentation

## 2020-05-30 DIAGNOSIS — Z3009 Encounter for other general counseling and advice on contraception: Secondary | ICD-10-CM | POA: Diagnosis not present

## 2020-05-30 LAB — HEMOGLOBIN, FINGERSTICK: Hemoglobin: 13.9 g/dL (ref 11.1–15.9)

## 2020-05-30 LAB — WET PREP FOR TRICH, YEAST, CLUE
Trichomonas Exam: NEGATIVE
Yeast Exam: NEGATIVE

## 2020-05-30 LAB — PREGNANCY, URINE: Preg Test, Ur: NEGATIVE

## 2020-05-30 MED ORDER — MEDROXYPROGESTERONE ACETATE 150 MG/ML IM SUSP
150.0000 mg | INTRAMUSCULAR | Status: AC
  Administered 2020-05-30 – 2020-09-12 (×2): 150 mg via INTRAMUSCULAR

## 2020-05-30 NOTE — Progress Notes (Signed)
[facility] DEPARTMENT [facility] [address] Main Number: [phone]    Family Planning Visit- Initial Visit  Subjective:  Wendy Robbins is a 35 y.o. SBF exsmoker G2P2002 (13, 1)  being seen today for an initial annual visit and to discuss contraceptive options.  The patient is currently using Depo Provera for pregnancy prevention. Patient reports she does not want a pregnancy in the next year.  Patient has the following medical conditions has Atopic dermatitis; Overweight BMI=26.1; and Asthma on their problem list.  Chief Complaint  Patient presents with  . Annual Exam    Contraception - desires Depo     Patient reports needs physical and DMPA today.  Last DMPA 02/01/20 (17 wks).  Last sex 05/17/20 without condom; with current partner off and on x 13 years; 1 partner in last 3 mo.  LMP spotting since 05/27/20.  Last pap 03/22/2018 neg HPV neg and due  03/23/23.  Last MJ yesterday. Last cigar age 61. Last ETOH yesterday (1 wine cooler) q weekend.  Living with her mom and her 2 kids.  Employed 40 hrs/wk. Not in school  Patient denies cigs, vaping  Body mass index is 26.14 kg/m. - Patient is eligible for diabetes screening based on BMI and age >7?  not applicable HA1C ordered? not applicable  Patient reports 1  partner/s in last year. Desires STI screening?  Yes  Has patient been screened once for HCV in the past?  No  No results found for: HCVAB  Does the patient have current drug use (including MJ), have a partner with drug use, and/or has been incarcerated since last result? Yes  If yes-- Screen for HCV through [facility]   Does the patient meet criteria for HBV testing? Yes  Criteria:  -Household, sexual or needle sharing contact with HBV -History of drug use -HIV positive -Those with known Hep C   Health Maintenance Due  Topic Date Due  . Hepatitis C Screening  Never done  . COVID-19 Vaccine (1) Never done  . INFLUENZA  VACCINE  10/01/2019    Review of Systems  All other systems reviewed and are negative.   The following portions of the patient's history were reviewed and updated as appropriate: allergies, current medications, past family history, past medical history, past social history, past surgical history and problem list. Problem list updated.   See flowsheet for other program required questions.  Objective:   Vitals:   05/30/20 1420  BP: 119/74  Pulse: 78  Weight: 179 lb 9.6 oz (81.5 kg)  Height: 5' 9.5" (1.765 m)    Physical Exam Constitutional:      Appearance: Normal appearance. She is normal weight.  HENT:     Head: Normocephalic and atraumatic.     Comments: Thyroid without masses or enlargement     Mouth/Throat:     Mouth: Mucous membranes are moist.     Comments: Last dental exam 03/2019 No visible caries Eyes:     Conjunctiva/sclera: Conjunctivae normal.  Cardiovascular:     Rate and Rhythm: Normal rate and regular rhythm.  Pulmonary:     Effort: Pulmonary effort is normal.     Breath sounds: Normal breath sounds.  Chest:  Breasts:     Right: Normal.     Left: Normal.    Abdominal:     Palpations: Abdomen is soft.     Comments: Soft without masses or tenderness   Genitourinary:    General: Normal  vulva.     Exam position: Lithotomy position.     Vagina: Bleeding (small amt red blood in vagina) present.     Cervix: Normal.     Uterus: Normal.      Adnexa: Right adnexa normal and left adnexa normal.     Rectum: Normal.  Musculoskeletal:        General: Normal range of motion.     Cervical back: Normal range of motion and neck supple.  Skin:    General: Skin is warm and dry.  Neurological:     Mental Status: She is alert.  Psychiatric:        Mood and Affect: Mood normal.       Assessment and Plan:  Wendy Robbins is a 35 y.o. female presenting to the [facility] Department for an initial annual wellness/contraceptive  visit  Contraception counseling: Reviewed all forms of birth control options in the tiered based approach. available including abstinence; over the counter/barrier methods; hormonal contraceptive medication including pill, patch, ring, injection,contraceptive implant, ECP; hormonal and nonhormonal IUDs; permanent sterilization options including vasectomy and the various tubal sterilization modalities. Risks, benefits, and typical effectiveness rates were reviewed.  Questions were answered.  Written information was also given to the patient to review.  Patient desires DMPA, this was prescribed for patient. She will follow up in  11-13 wks for surveillance.  She was told to call with any further questions, or with any concerns about this method of contraception.  Emphasized use of condoms 100% of the time for STI prevention.  Patient was offered ECP. ECP was not accepted by the patient. ECP counseling was not given - see RN documentation  1. Family planning Treat wet mount per standing orders Immunization nurse consult - Hemoglobin, venipuncture - Pregnancy, urine - WET PREP FOR TRICH, YEAST, CLUE - Syphilis Serology, [facility] - HIV [place] [facility] - Chlamydia/Gonorrhea [place] [facility] - medroxyPROGESTERone (DEPO-PROVERA) injection 150 mg  2. Encounter for surveillance of injectable contraceptive If PT neg today may have DMPA 150 mg IM q 11-13 wks x 1 year Please counsel pt to abstain/use back up condoms next 7 days  3. Overweight BMI=26.1   4. Uncomplicated asthma, unspecified asthma severity, unspecified whether persistent      No follow-ups on file.  No future appointments.  Alberteen Spindle, CNM

## 2020-05-30 NOTE — Progress Notes (Signed)
Folic acid counseling completed and encouraged MVI daily instead of intermittent dosing as currently using.. Counseled on importance of Depo q 11 - 13 weeks for maximum pregnancy prevention and to minimize issues with vaginal bleeding. Jossie Ng, RN  UPT negative, wet mount negative and hgb = 13.9. Per E. Sciora CNM, may have Depo 150 mg IM q 11 - 13 weeks x1 year. Client tolerated Depo injection without complaint. Jossie Ng, RN

## 2020-06-05 LAB — HM HIV SCREENING LAB: HM HIV Screening: NEGATIVE

## 2020-09-05 ENCOUNTER — Ambulatory Visit: Payer: Medicaid Other

## 2020-09-12 ENCOUNTER — Ambulatory Visit (LOCAL_COMMUNITY_HEALTH_CENTER): Payer: Medicaid Other

## 2020-09-12 ENCOUNTER — Other Ambulatory Visit: Payer: Self-pay

## 2020-09-12 VITALS — BP 127/80 | Ht 69.5 in | Wt 173.5 lb

## 2020-09-12 DIAGNOSIS — Z3009 Encounter for other general counseling and advice on contraception: Secondary | ICD-10-CM

## 2020-09-12 DIAGNOSIS — Z3042 Encounter for surveillance of injectable contraceptive: Secondary | ICD-10-CM

## 2020-09-12 NOTE — Progress Notes (Signed)
15 weeks post depo. Voices no concerns. RN counseled pt about impt of having depo consistently every 11-13 weeks. Pt in agreement and pt reports work schedule prevented her from having depo earlier. Depo given today per order by Hazle Coca, CNM dated 05/30/2020. Tolerated well R Delt. Next depo due 11/28/2020, has reminder.  Jerel Shepherd, RN

## 2020-10-13 ENCOUNTER — Ambulatory Visit
Admission: EM | Admit: 2020-10-13 | Discharge: 2020-10-13 | Disposition: A | Discharge status: Home / Self Care | Payer: BLUE CROSS/BLUE SHIELD | Attending: Emergency Medicine | Admitting: Emergency Medicine

## 2020-10-13 ENCOUNTER — Encounter: Payer: Self-pay | Admitting: Emergency Medicine

## 2020-10-13 ENCOUNTER — Ambulatory Visit: Payer: Self-pay

## 2020-10-13 ENCOUNTER — Ambulatory Visit (INDEPENDENT_AMBULATORY_CARE_PROVIDER_SITE_OTHER): Payer: BLUE CROSS/BLUE SHIELD

## 2020-10-13 ENCOUNTER — Other Ambulatory Visit: Payer: Self-pay

## 2020-10-13 DIAGNOSIS — R059 Cough, unspecified: Secondary | ICD-10-CM | POA: Diagnosis not present

## 2020-10-13 DIAGNOSIS — J189 Pneumonia, unspecified organism: Secondary | ICD-10-CM | POA: Diagnosis not present

## 2020-10-13 DIAGNOSIS — R071 Chest pain on breathing: Secondary | ICD-10-CM | POA: Diagnosis not present

## 2020-10-13 MED ORDER — LEVOFLOXACIN 500 MG PO TABS: 500.0000 mg | ORAL_TABLET | Freq: Every day | ORAL | 0 refills | Status: DC

## 2020-10-13 MED ORDER — TRAMADOL HCL 50 MG PO TABS: 50.0000 mg | ORAL_TABLET | Freq: Four times a day (QID) | ORAL | 0 refills | Status: DC | PRN

## 2020-10-13 NOTE — ED Provider Notes (Signed)
[facility]    CSN: [id number] Arrival date & time: 10/13/20  1224      History   Chief Complaint Chief Complaint  Patient presents with   Back Pain   Chest Pain    HPI Wendy Robbins is a 35 y.o. female.   HPI  35 year old female here for evaluation of right-sided chest and back pain.  Patient reports that she first noticed some pain 2 days ago after she had a minor cough.  This is gradually increased and become worse.  She now has pain with deep breathing or with movement.  She states she cannot sleep on her right side because the pain is too intense.  She denies any injury, shortness of breath, or or protracted cough.  She is never had a dental as happened in the past.  She states she has not really done any heavy lifting or exercise out of the ordinary.  The only activity she is done in the past couple days is laundry which is nothing new as far as activity goes for this patient.  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    Patient Active Problem List   Diagnosis Date Noted   Overweight BMI=26.1 05/30/2020   Asthma 05/30/2020   Atopic dermatitis 01/12/2014    Past Surgical History:  Procedure Laterality Date   CESAREAN SECTION N/A 03/06/2019   Procedure: CESAREAN SECTION;  Surgeon: Christeen Douglas, MD;  Location: [facility];  Service: Obstetrics;  Laterality: N/A;   HEMATOMA EVACUATION  03/07/2019   Procedure: EVACUATION HEMATOMA-HYSTEROTOMY OVERSEW;  Surgeon: Christeen Douglas, MD;  Location: [facility];  Service: Gynecology;;   history of C-section  03/05/2018   LAPAROTOMY N/A 03/07/2019   Procedure: EXPLORATORY LAPAROTOMY;  Surgeon: Christeen Douglas, MD;  Location: [facility];  Service: Gynecology;  Laterality: N/A;   WISDOM TOOTH EXTRACTION      OB History     Gravida  2   Para  2   Term  2   Preterm  0    AB  0   Living  2      SAB      IAB      Ectopic      Multiple  0   Live Births  2            Home Medications    Prior to Admission medications   Medication Sig Start Date End Date Taking? Authorizing Provider  levofloxacin (LEVAQUIN) 500 MG tablet Take 1 tablet (500 mg total) by mouth daily. 10/13/20  Yes Becky Augusta, NP  Multiple Vitamin (MULTIVITAMIN) tablet Take 1 tablet by mouth daily.   Yes [provider]  traMADol (ULTRAM) 50 MG tablet Take 1 tablet (50 mg total) by mouth every 6 (six) hours as needed. 10/13/20  Yes Becky Augusta, NP    Family History Family History  Problem Relation Age of Onset   Hypertension Mother    Lung cancer Mother    Diabetes Maternal Grandmother    Kidney disease Maternal Grandmother    Breast cancer Maternal Aunt    Diabetes Paternal Grandfather    Cancer Paternal Grandmother    Diabetes Maternal Aunt    Hypertension Maternal Aunt    Diabetes Maternal Uncle     Social History  Social History   Tobacco Use   Smoking status: Former    Types: Cigars   Smokeless tobacco: Never   Tobacco comments:    Tried Black and Milds in high school very short time.  Vaping Use   Vaping Use: Never used  Substance Use Topics   Alcohol use: Yes    Alcohol/week: 1.0 standard drink    Types: 1 Glasses of wine per week    Comment: Last ETOH use 05/29/2020.   Drug use: Not Currently    Types: Marijuana    Comment: Last marijuana use 05/29/2020.     Allergies   Shellfish-derived products and Shellfish allergy   Review of Systems Review of Systems  Constitutional:  Negative for activity change, appetite change and fever.  Respiratory:  Negative for cough and shortness of breath.   Cardiovascular:  Positive for chest pain. Negative for palpitations.  Hematological: Negative.   Psychiatric/Behavioral: Negative.      Physical Exam Triage Vital Signs ED Triage Vitals  Enc Vitals Group     BP 10/13/20 1307 124/89      Pulse Rate 10/13/20 1307 90     Resp 10/13/20 1307 14     Temp 10/13/20 1307 99.1 F (37.3 C)     Temp Source 10/13/20 1307 Oral     SpO2 10/13/20 1307 100 %     Weight 10/13/20 1303 180 lb (81.6 kg)     Height 10/13/20 1303 5\' 11"  (1.803 m)     Head Circumference --      Peak Flow --      Pain Score 10/13/20 1303 8     Pain Loc --      Pain Edu? --      Excl. in GC? --    No data found.  Updated Vital Signs BP 124/89 (BP Location: Left Arm)   Pulse 90   Temp 99.1 F (37.3 C) (Oral)   Resp 14   Ht 5\' 11"  (1.803 m)   Wt 180 lb (81.6 kg)   SpO2 100%   BMI 25.10 kg/m   Visual Acuity Right Eye Distance:   Left Eye Distance:   Bilateral Distance:    Right Eye Near:   Left Eye Near:    Bilateral Near:     Physical Exam Vitals and nursing note reviewed.  Constitutional:      General: She is not in acute distress.    Appearance: Normal appearance. She is normal weight. She is not ill-appearing.  HENT:     Head: Normocephalic and atraumatic.  Cardiovascular:     Rate and Rhythm: Normal rate and regular rhythm.     Pulses: Normal pulses.     Heart sounds: Normal heart sounds. No murmur heard.   No gallop.  Pulmonary:     Effort: Pulmonary effort is normal.     Breath sounds: Normal breath sounds. No wheezing, rhonchi or rales.  Chest:     Chest wall: No tenderness.  Skin:    General: Skin is warm and dry.     Capillary Refill: Capillary refill takes less than 2 seconds.     Findings: No erythema or rash.  Neurological:     General: No focal deficit present.     Mental Status: She is alert and oriented to person, place, and time.  Psychiatric:        Mood and Affect: Mood normal.        Behavior: Behavior normal.  Thought Content: Thought content normal.        Judgment: Judgment normal.     UC Treatments / Results  Labs (all labs ordered are listed, but only abnormal results are displayed) Labs Reviewed - No data to  display  EKG   Radiology DG Chest 2 View  Result Date: 10/13/2020 CLINICAL DATA:  Pain with inspiration.  No known trauma.  Cough. EXAM: CHEST - 2 VIEW COMPARISON:  None. FINDINGS: Infiltrate is seen in the right lung base. The heart, hila, mediastinum, lungs, and pleura are otherwise unremarkable. IMPRESSION: Right lower lobe infiltrate consistent with pneumonia or aspiration. Recommend short-term follow-up imaging after treatment to ensure resolution. Electronically Signed   By: Gerome Sam III M.D.   On: 10/13/2020 14:08    Procedures Procedures (including critical care time)  Medications Ordered in UC Medications - No data to display  Initial Impression / Assessment and Plan / UC Course  I have reviewed the triage vital signs and the nursing notes.  Pertinent labs & imaging results that were available during my care of the patient were reviewed by me and considered in my medical decision making (see chart for details).  Patient is a pleasant, nontoxic-appearing 35 year old female here for evaluation of right-sided chest and back pain that started 2 days ago without any known injury or provoking incident.  Patient reports that she did have a mild cough when she cleared her throat and noticed some pain in her back.  Pain is progressed now and is in the right side of her chest as well and increases with deep breathing and with movement.  The pain is not reproducible on palpation.  Physical exam reveals clear lung sounds in all fields and heart sounds are S1-S2 and crisp without murmur, gallop, rub, or ectopy.  There is no tenderness with palpation of the anterior posterior rib cage.  There is no erythema or bruising noted on the skin.  Suspect patient's pain is either musculoskeletal or pleuritic.  Will obtain chest x-ray to look for acute intrathoracic process.  EKG was collected at triage.  EKG shows sinus rhythm with a ventricular rate of 70 bpm.  PR interval is short with a duration  of 110 ms, QRS duration 72 ms, QT 382 ms, QTC 435 ms.  There is no ST elevation or depression noted.  There is no delta wave present in any of the leads.  No previous EKGs available for comparison.  Radiology interpretation of x-ray shows a right lower lobe infiltrate consistent with pneumonia.  Patient is pneumonia can explain her chest pain.  Will treat patient with Levaquin 500 mg daily for 7 days for treatment of her pneumonia.  Patient can use over-the-counter ibuprofen and Tylenol as needed for mild to moderate pain.  I will give the patient a prescription for tramadol to help with severe pain.  Patient to follow-up with her primary care provider as they are recommending follow-up imaging in 4 to 6 weeks to ensure resolution.   Final Clinical Impressions(s) / UC Diagnoses   Final diagnoses:  Pneumonia of right lower lobe due to infectious organism     Discharge Instructions      Your chest x-ray revealed the presence of pneumonia in your right lower lobe which can be causing your right-sided chest pain.  I am prescribing Levaquin 500 mg tablets and I want you to take 1 tablet daily for 7 days.  Use over-the-counter Tylenol and ibuprofen according to the package instructions as needed  for mild to moderate pain and I prescribed some tramadol that she can use for severe pain and at bedtime.  This medication will make you drowsy so do not drive a vehicle, operate machinery, or drink alcohol if you take it.  Return for reevaluation for any new or worsening symptoms.  Follow-up with your primary care provider as well as they are recommending a repeat chest x-ray in 4 to 6 weeks to ensure resolution of the pneumonia.     ED Prescriptions     Medication Sig Dispense Auth. Provider   levofloxacin (LEVAQUIN) 500 MG tablet Take 1 tablet (500 mg total) by mouth daily. 7 tablet Becky Augusta, NP   traMADol (ULTRAM) 50 MG tablet Take 1 tablet (50 mg total) by mouth every 6 (six) hours as  needed. 15 tablet Becky Augusta, NP      I have reviewed the PDMP during this encounter.   Becky Augusta, NP 10/13/20 1429

## 2020-10-13 NOTE — Discharge Instructions (Addendum)
Your chest x-ray revealed the presence of pneumonia in your right lower lobe which can be causing your right-sided chest pain.  I am prescribing Levaquin 500 mg tablets and I want you to take 1 tablet daily for 7 days.  Use over-the-counter Tylenol and ibuprofen according to the package instructions as needed for mild to moderate pain and I prescribed some tramadol that she can use for severe pain and at bedtime.  This medication will make you drowsy so do not drive a vehicle, operate machinery, or drink alcohol if you take it.  Return for reevaluation for any new or worsening symptoms.  Follow-up with your primary care provider as well as they are recommending a repeat chest x-ray in 4 to 6 weeks to ensure resolution of the pneumonia.

## 2020-10-13 NOTE — ED Triage Notes (Signed)
Patient c/o right side back and chest pain that started 2 days ago.  Patient reports pain worsens with a deep breath.  Patient denies fevers or cold symptoms.  Patient denies injury or fall.

## 2020-11-08 ENCOUNTER — Other Ambulatory Visit: Payer: Self-pay

## 2020-11-08 ENCOUNTER — Ambulatory Visit
Admission: EM | Admit: 2020-11-08 | Discharge: 2020-11-08 | Disposition: A | Discharge status: Home / Self Care | Payer: BLUE CROSS/BLUE SHIELD | Attending: Family Medicine | Admitting: Family Medicine

## 2020-11-08 ENCOUNTER — Ambulatory Visit (INDEPENDENT_AMBULATORY_CARE_PROVIDER_SITE_OTHER): Payer: BLUE CROSS/BLUE SHIELD

## 2020-11-08 DIAGNOSIS — J189 Pneumonia, unspecified organism: Secondary | ICD-10-CM | POA: Insufficient documentation

## 2020-11-08 DIAGNOSIS — R059 Cough, unspecified: Secondary | ICD-10-CM

## 2020-11-08 LAB — CBC WITH DIFFERENTIAL/PLATELET
Abs Immature Granulocytes: 0.03 10*3/uL (ref 0.00–0.07)
Basophils Absolute: 0.1 10*3/uL (ref 0.0–0.1)
Basophils Relative: 1 %
Eosinophils Absolute: 0.3 10*3/uL (ref 0.0–0.5)
Eosinophils Relative: 3 %
HCT: 39.6 % (ref 36.0–46.0)
Hemoglobin: 13.4 g/dL (ref 12.0–15.0)
Immature Granulocytes: 0 %
Lymphocytes Relative: 22 %
Lymphs Abs: 2.2 10*3/uL (ref 0.7–4.0)
MCH: 31.5 pg (ref 26.0–34.0)
MCHC: 33.8 g/dL (ref 30.0–36.0)
MCV: 93.2 fL (ref 80.0–100.0)
Monocytes Absolute: 0.7 10*3/uL (ref 0.1–1.0)
Monocytes Relative: 7 %
Neutro Abs: 6.7 10*3/uL (ref 1.7–7.7)
Neutrophils Relative %: 67 %
Platelets: 191 10*3/uL (ref 150–400)
RBC: 4.25 MIL/uL (ref 3.87–5.11)
RDW: 13.2 % (ref 11.5–15.5)
WBC: 10 10*3/uL (ref 4.0–10.5)
nRBC: 0 % (ref 0.0–0.2)

## 2020-11-08 LAB — COMPREHENSIVE METABOLIC PANEL
ALT: 51 U/L — ABNORMAL HIGH (ref 0–44)
AST: 45 U/L — ABNORMAL HIGH (ref 15–41)
Albumin: 4.2 g/dL (ref 3.5–5.0)
Alkaline Phosphatase: 89 U/L (ref 38–126)
Anion gap: 8 (ref 5–15)
BUN: 10 mg/dL (ref 6–20)
CO2: 24 mmol/L (ref 22–32)
Calcium: 8.8 mg/dL — ABNORMAL LOW (ref 8.9–10.3)
Chloride: 102 mmol/L (ref 98–111)
Creatinine, Ser: 0.78 mg/dL (ref 0.44–1.00)
GFR, Estimated: >60 mL/min (ref >60)
Glucose, Bld: 94 mg/dL (ref 70–99)
Potassium: 3.8 mmol/L (ref 3.5–5.1)
Sodium: 134 mmol/L — ABNORMAL LOW (ref 135–145)
Total Bilirubin: 1.4 mg/dL — ABNORMAL HIGH (ref 0.3–1.2)
Total Protein: 7.7 g/dL (ref 6.5–8.1)

## 2020-11-08 LAB — D-DIMER, QUANTITATIVE: D-Dimer, Quant: 2.35 ug/mL-FEU — ABNORMAL HIGH (ref 0.00–0.50)

## 2020-11-08 MED ORDER — AZITHROMYCIN 250 MG PO TABS: ORAL_TABLET | ORAL | 0 refills | Status: AC

## 2020-11-08 MED ORDER — AMOXICILLIN-POT CLAVULANATE 875-125 MG PO TABS: 1.0000 | ORAL_TABLET | Freq: Two times a day (BID) | ORAL | 0 refills | Status: AC

## 2020-11-08 NOTE — ED Provider Notes (Signed)
[facility]    CSN: [id number] Arrival date & time: 11/08/20  1104      History   Chief Complaint Chief Complaint  Patient presents with  . Cough    HPI  35 year old female presents with right-sided chest pain and cough.  Patient recently seen on 8/14.  Chest x-ray revealed pneumonia and she was placed on Levaquin.  Patient states that she completed her antibiotic course and was doing much better.  However, 2 days ago she developed symptoms yet again.  She reports right-sided chest discomfort per tickly with taking a deep breath.  She also reports cough.  No significant shortness of breath at this time.  Pain 7/10 in severity.  No relieving factors.  Patient currently afebrile.  Past Medical History:  Diagnosis Date  . Anemia   . Asthma   . History of abnormal cervical Pap smear   . History of chlamydia    treated  . 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 Overweight BMI=26.1 05/30/2020  . Asthma 05/30/2020  . Atopic dermatitis 01/12/2014    Past Surgical History:  Procedure Laterality Date  . CESAREAN SECTION N/A 03/06/2019   Procedure: CESAREAN SECTION;  Surgeon: Christeen Douglas, MD;  Location: [facility];  Service: Obstetrics;  Laterality: N/A;  . HEMATOMA EVACUATION  03/07/2019   Procedure: EVACUATION HEMATOMA-HYSTEROTOMY OVERSEW;  Surgeon: Christeen Douglas, MD;  Location: [facility];  Service: Gynecology;;  . history of C-section  03/05/2018  . LAPAROTOMY N/A 03/07/2019   Procedure: EXPLORATORY LAPAROTOMY;  Surgeon: Christeen Douglas, MD;  Location: [facility];  Service: Gynecology;  Laterality: N/A;  . WISDOM TOOTH EXTRACTION      OB History     Gravida  2   Para  2   Term  2   Preterm  0   AB  0   Living  2      SAB      IAB      Ectopic      Multiple  0   Live Births  2            Home Medications    Prior to Admission medications   Medication Sig Start Date End Date  Taking? Authorizing Provider  amoxicillin-clavulanate (AUGMENTIN) 875-125 MG tablet Take 1 tablet by mouth 2 (two) times daily. 11/08/20  Yes Dacoda Finlay G, DO  azithromycin (ZITHROMAX) 250 MG tablet 2 tablets on day 1, then 1 tablet daily on days 2-5. 11/08/20  Yes Everlene Other G, DO  Multiple Vitamin (MULTIVITAMIN) tablet Take 1 tablet by mouth daily.   Yes [provider]    Family History Family History  Problem Relation Age of Onset  . Hypertension Mother   . Lung cancer Mother   . Diabetes Maternal Grandmother   . Kidney disease Maternal Grandmother   . Breast cancer Maternal Aunt   . Diabetes Paternal Grandfather   . Cancer Paternal Grandmother   . Diabetes Maternal Aunt   . Hypertension Maternal Aunt   . Diabetes Maternal Uncle     Social History Social History   Tobacco Use  . Smoking status: Former    Types: Cigars  . Smokeless tobacco: Never  . Tobacco comments:    Tried Black and Milds in high school very short time.  Vaping Use  . Vaping Use: Never used  Substance Use Topics  . Alcohol use: Yes  Alcohol/week: 1.0 standard drink    Types: 1 Glasses of wine per week    Comment: Last ETOH use 05/29/2020.  . Drug use: Not Currently    Types: Marijuana    Comment: Last marijuana use 05/29/2020.     Allergies   Shellfish-derived products and Shellfish allergy   Review of Systems Review of Systems Per HPI  Physical Exam Triage Vital Signs ED Triage Vitals  Enc Vitals Group     BP 11/08/20 1140 121/84     Pulse Rate 11/08/20 1140 75     Resp 11/08/20 1140 18     Temp 11/08/20 1140 98.4 F (36.9 C)     Temp Source 11/08/20 1140 Oral     SpO2 11/08/20 1140 100 %     Weight 11/08/20 1139 175 lb (79.4 kg)     Height 11/08/20 1139 5\' 11"  (1.803 m)     Head Circumference --      Peak Flow --      Pain Score 11/08/20 1139 7     Pain Loc --      Pain Edu? --      Excl. in GC? --    Updated Vital Signs BP 121/84 (BP Location: Left Arm)   Pulse  75   Temp 98.4 F (36.9 C) (Oral)   Resp 18   Ht 5\' 11"  (1.803 m)   Wt 79.4 kg   SpO2 100%   BMI 24.41 kg/m   Visual Acuity Right Eye Distance:   Left Eye Distance:   Bilateral Distance:    Right Eye Near:   Left Eye Near:    Bilateral Near:     Physical Exam Vitals and nursing note reviewed.  Constitutional:      General: She is not in acute distress.    Appearance: Normal appearance. She is not ill-appearing.  HENT:     Head: Normocephalic and atraumatic.  Eyes:     General:        Right eye: No discharge.        Left eye: No discharge.     Conjunctiva/sclera: Conjunctivae normal.  Cardiovascular:     Rate and Rhythm: Normal rate and regular rhythm.     Heart sounds: No murmur heard. Pulmonary:     Effort: Pulmonary effort is normal.     Breath sounds: Normal breath sounds. No wheezing, rhonchi or rales.  Neurological:     Mental Status: She is alert.  Psychiatric:        Mood and Affect: Mood normal.        Behavior: Behavior normal.     UC Treatments / Results  Labs (all labs ordered are listed, but only abnormal results are displayed) Labs Reviewed  COMPREHENSIVE METABOLIC PANEL - Abnormal; Notable for the following components:      Result Value   Sodium 134 (*)    Calcium 8.8 (*)    AST 45 (*)    ALT 51 (*)    Total Bilirubin 1.4 (*)    All other components within normal limits  CBC WITH DIFFERENTIAL/PLATELET  D-DIMER, QUANTITATIVE    EKG   Radiology DG Chest 2 View  Result Date: 11/08/2020 CLINICAL DATA:  35 year old female with history of pneumonia 1 month ago with persistent cough. EXAM: CHEST - 2 VIEW COMPARISON:  Chest x-ray 10/13/2020. FINDINGS: Persistent ill-defined area of airspace consolidation in the posterior aspect of the right lower lobe. Trace volume of parapneumonic pleural fluid. Left lung is clear. No  pneumothorax. No evidence of pulmonary edema. Heart size is normal. Upper mediastinal contours are within normal limits.  IMPRESSION: 1. Persistent but decreasing area of airspace consolidation in the right lower lobe, compatible with incompletely resolved pneumonia with trace parapneumonic pleural effusion. Followup PA and lateral chest X-ray is recommended in 3-4 weeks following trial of antibiotic therapy to ensure resolution and exclude underlying malignancy. Electronically Signed   By: Trudie Reed M.D.   On: 11/08/2020 12:22    Procedures Procedures (including critical care time)  Medications Ordered in UC Medications - No data to display  Initial Impression / Assessment and Plan / UC Course  I have reviewed the triage vital signs and the nursing notes.  Pertinent labs & imaging results that were available during my care of the patient were reviewed by me and considered in my medical decision making (see chart for details).    35 year old female presents with cough, pleuritic pain.  Repeat chest x-ray was obtained and was independently interpreted by me.  Interpretation: Persistent infiltrate in the right lower lobe.  This is improved from her prior chest x-ray but is still present.  Associated effusion.  I discussed this with the patient.  Laboratory studies obtained.  Awaiting D-dimer results.  Placing on azithromycin and Augmentin.  Needs follow-up chest x-ray and possibly CT imaging.  She is to go to the hospital if she fails to improve or worsens.  Final Clinical Impressions(s) / UC Diagnoses   Final diagnoses:  Persistent pneumonia     Discharge Instructions      Rest.  Antibiotics as prescribed.  If you worsen, you need to go directly to the hospital.  I will call with results shortly.  Take care  Dr. Adriana Simas    ED Prescriptions     Medication Sig Dispense Auth. Provider   amoxicillin-clavulanate (AUGMENTIN) 875-125 MG tablet Take 1 tablet by mouth 2 (two) times daily. 20 tablet Delroy Ordway G, DO   azithromycin (ZITHROMAX) 250 MG tablet 2 tablets on day 1, then 1 tablet daily on  days 2-5. 6 tablet Tommie Sams, DO      PDMP not reviewed this encounter.   Tommie Sams, [place] 11/08/20 1315

## 2020-11-08 NOTE — ED Triage Notes (Signed)
Patient states that she was diagnosed with pneumonia on 8/14. States that she had been improving and symptoms came back 2 days ago. States that she is having right sided chest pain, worse with breathing, coughing and shortness of breath.

## 2020-11-08 NOTE — Discharge Instructions (Signed)
Rest.  Antibiotics as prescribed.  If you worsen, you need to go directly to the hospital.  I will call with results shortly.  Take care  Dr. Adriana Simas

## 2022-11-04 IMAGING — CR DG CHEST 2V
3 series · 3 of 3 positions shown · non-contrast
Comparison: Chest x-ray 10/13/2020.

CLINICAL DATA: 34-year-old female with history of pneumonia 1 month
ago with persistent cough.

EXAM:
CHEST - 2 VIEW

[chest pa]
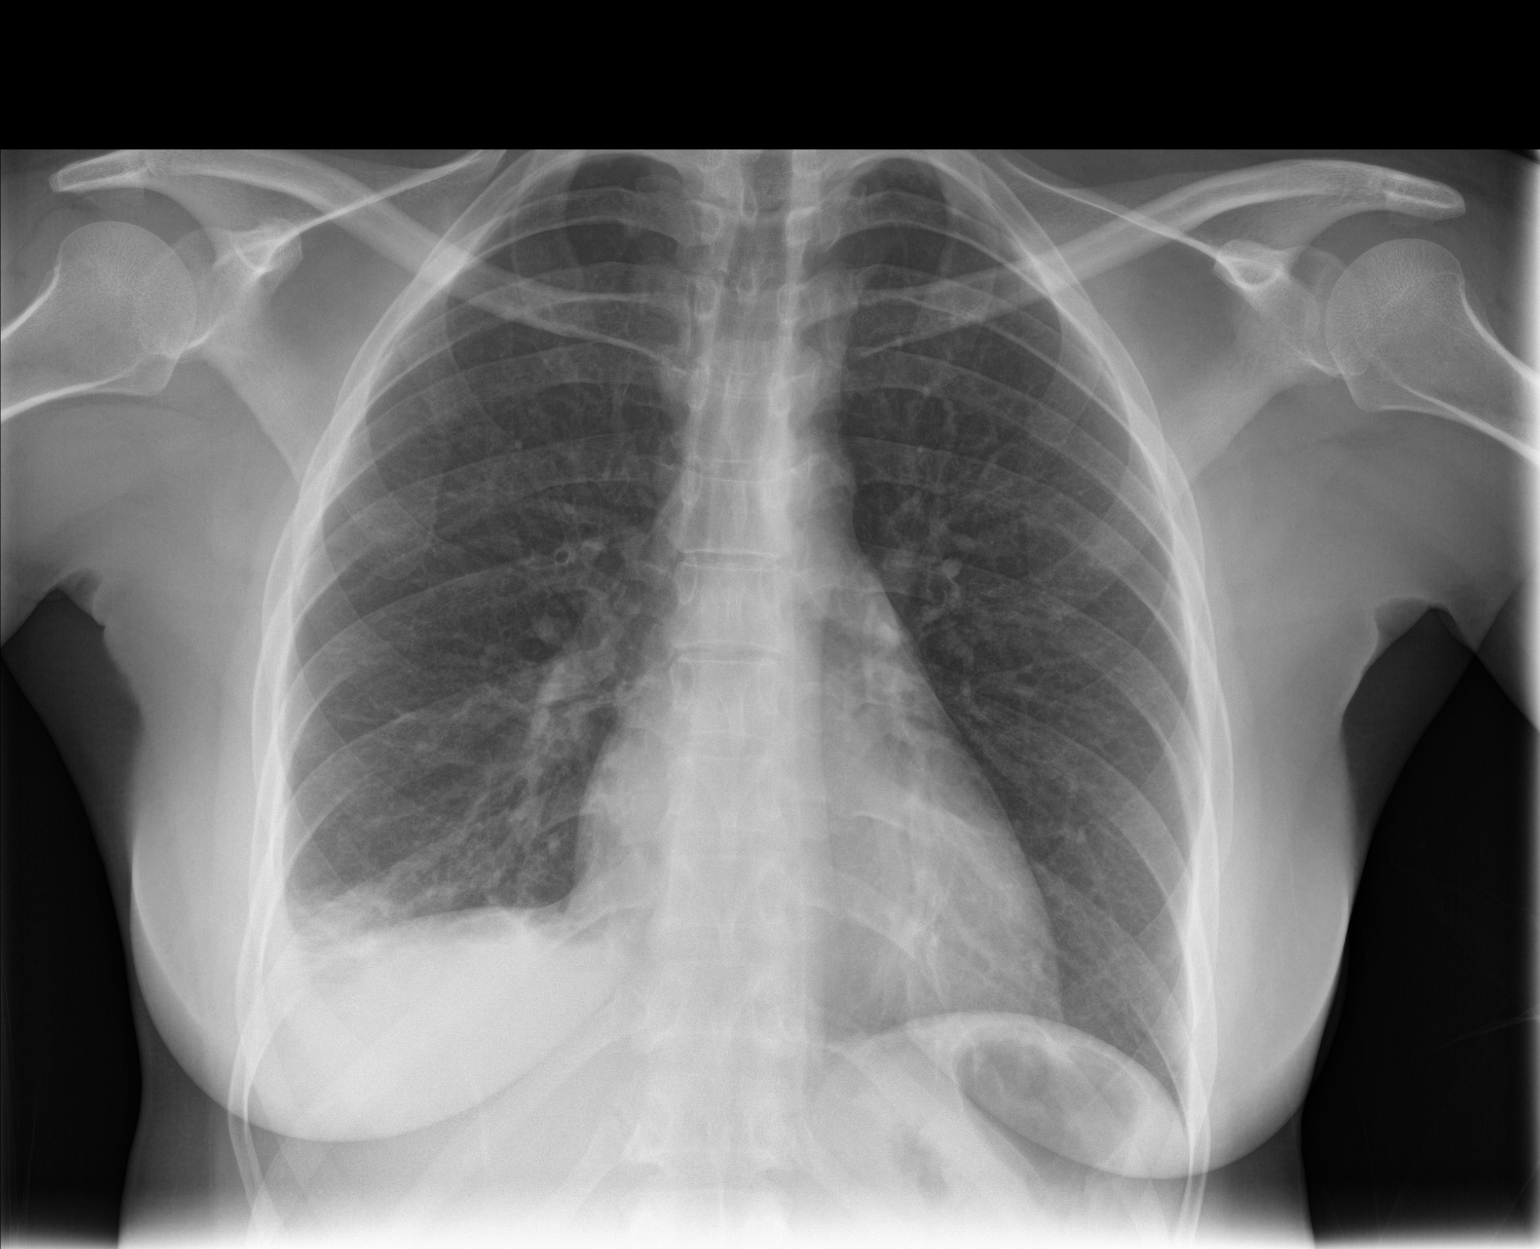

[chest lat (1 of 2)]
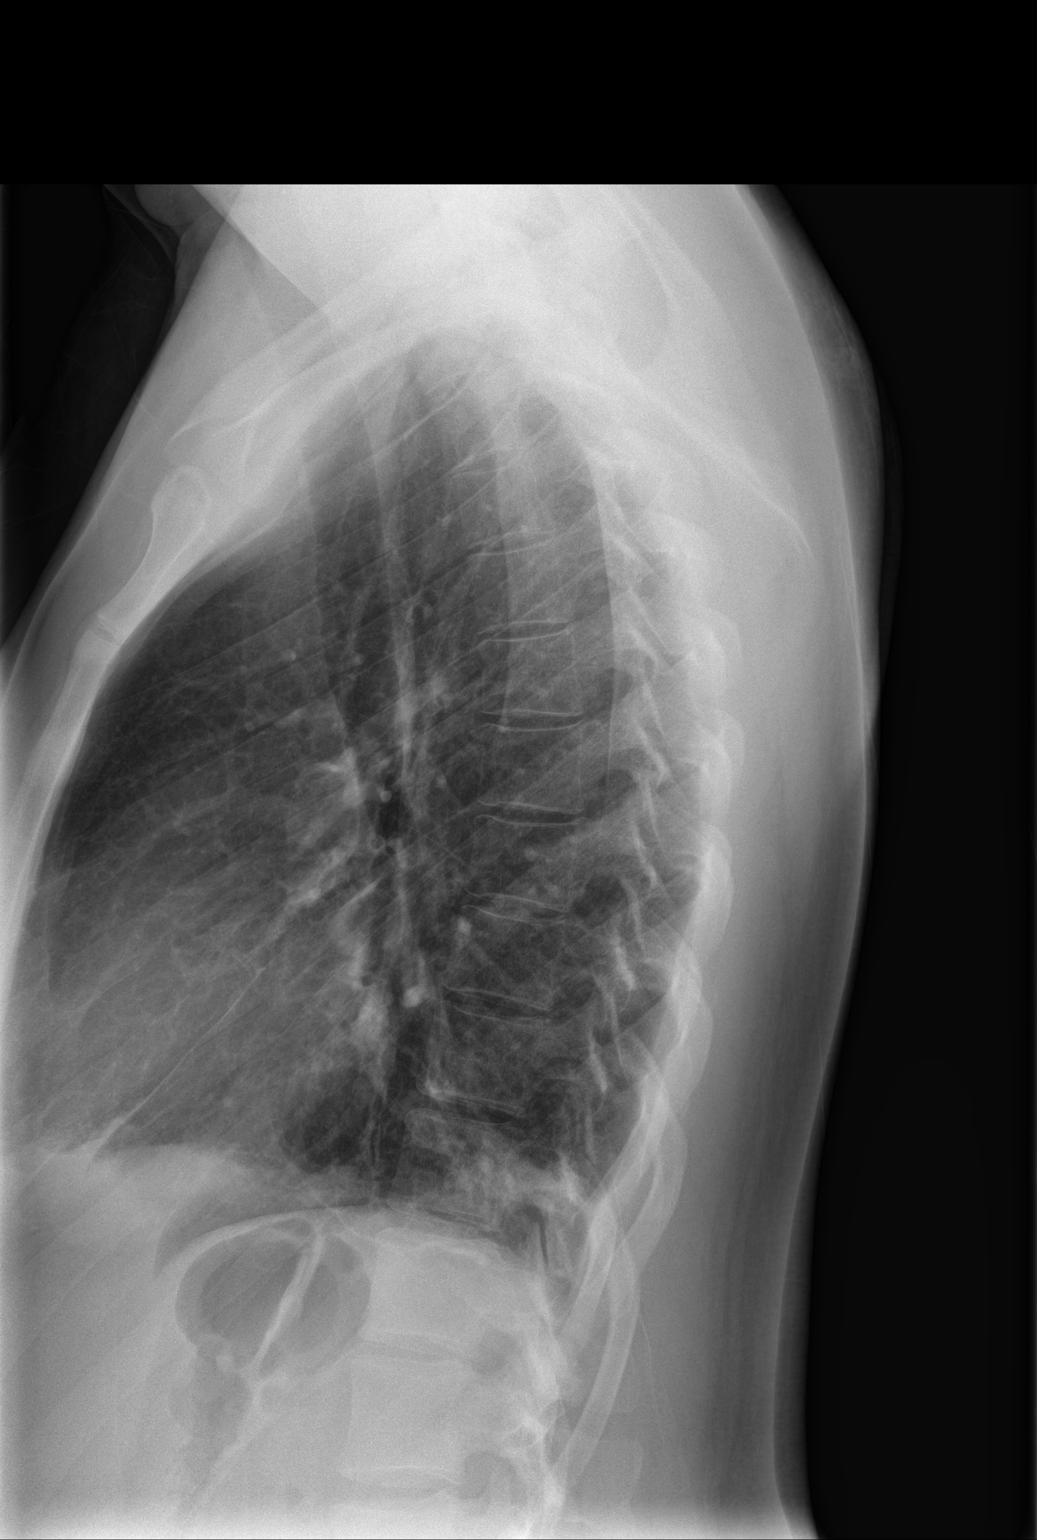

[chest lat (2 of 2)]
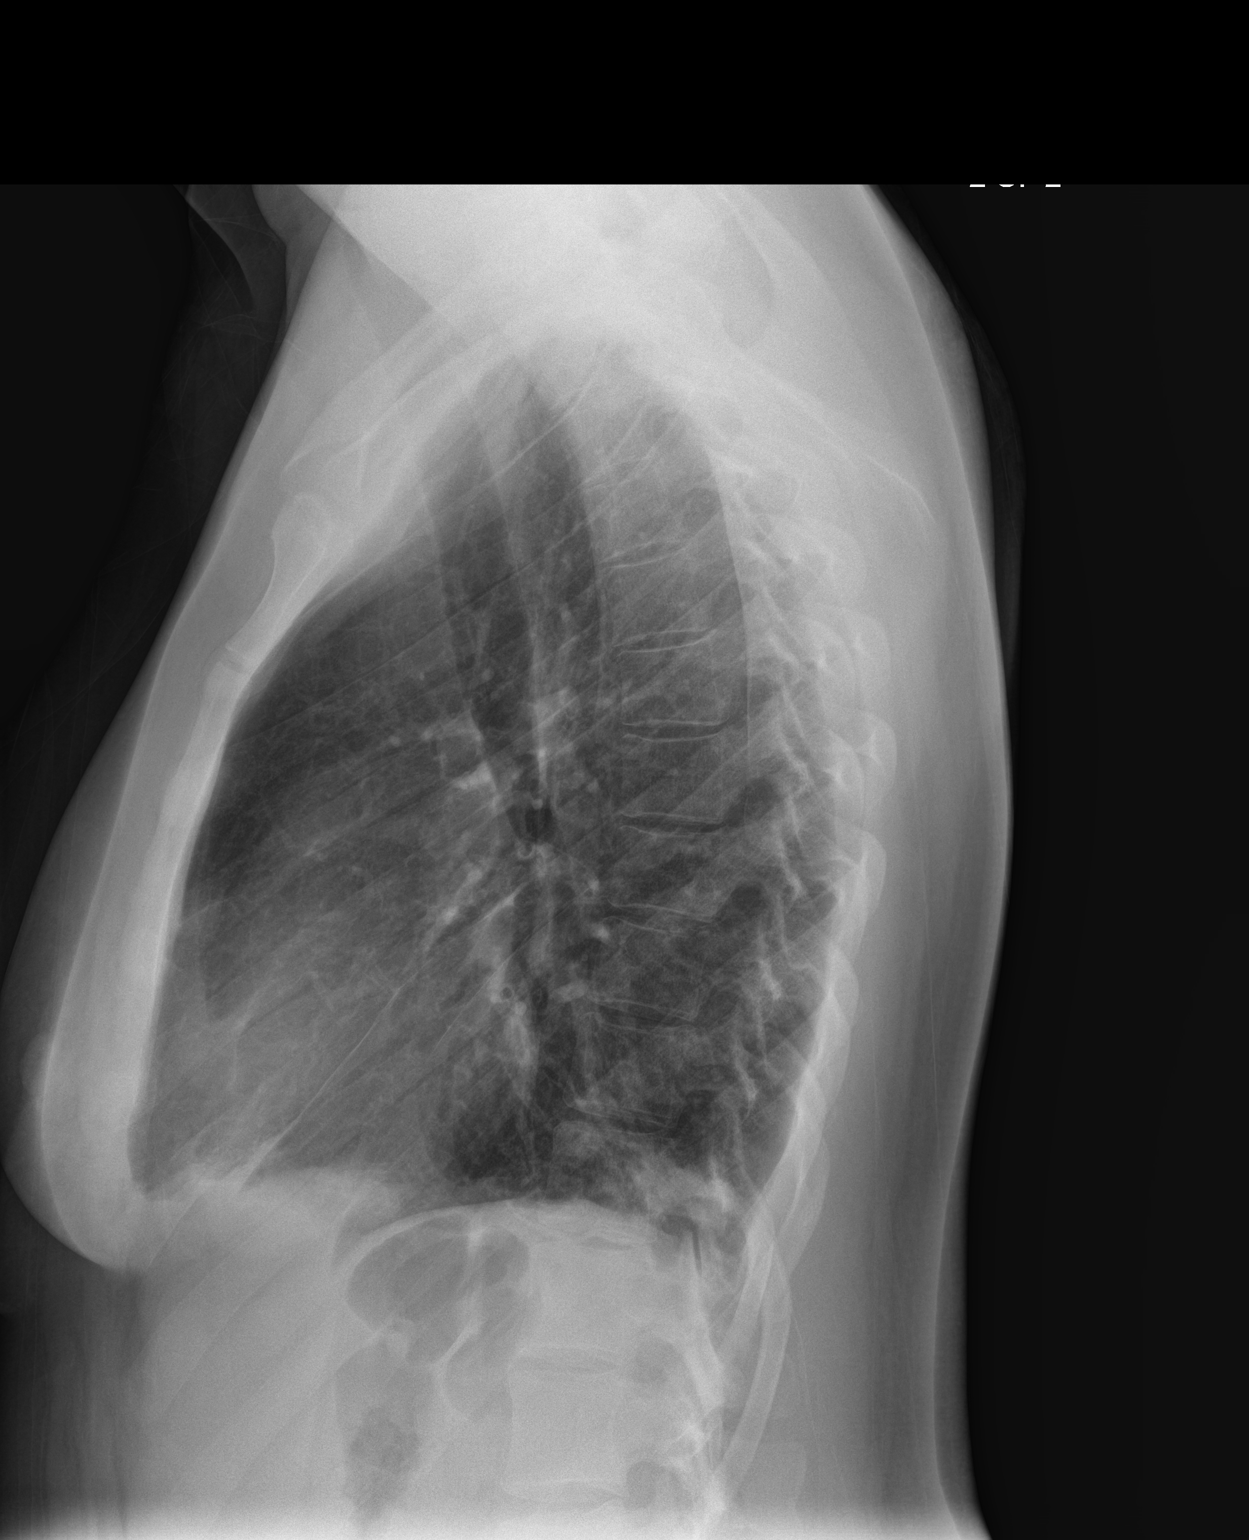

[3 of 3 positions shown; findings below may reference images not displayed]

FINDINGS: Persistent ill-defined area of airspace consolidation in the
posterior aspect of the right lower lobe. Trace volume of
parapneumonic pleural fluid. Left lung is clear. No pneumothorax. No
evidence of pulmonary edema. Heart size is normal. Upper mediastinal
contours are within normal limits.
IMPRESSION: 1. Persistent but decreasing area of airspace consolidation in the
right lower lobe, compatible with incompletely resolved pneumonia
with trace parapneumonic pleural effusion. Followup PA and lateral
chest X-ray is recommended in 3-4 weeks following trial of
antibiotic therapy to ensure resolution and exclude underlying
malignancy.
